# Patient Record
Sex: Female | Born: 1980 | Hispanic: No | Marital: Single | Smoking: Never smoker
Health system: Southern US, Community
[De-identification: ages and names within clinical notes are randomized; demographics above are authoritative.]

## PROBLEM LIST (undated history)

## (undated) DIAGNOSIS — R519 Headache, unspecified: Secondary | ICD-10-CM

## (undated) DIAGNOSIS — K219 Gastro-esophageal reflux disease without esophagitis: Secondary | ICD-10-CM

## (undated) DIAGNOSIS — I10 Essential (primary) hypertension: Secondary | ICD-10-CM

## (undated) DIAGNOSIS — R87629 Unspecified abnormal cytological findings in specimens from vagina: Secondary | ICD-10-CM

## (undated) HISTORY — PX: APPENDECTOMY (OPEN): SHX54

## (undated) HISTORY — DX: Essential (primary) hypertension: I10

## (undated) HISTORY — PX: APPENDECTOMY: SHX54

## (undated) HISTORY — DX: Unspecified abnormal cytological findings in specimens from vagina: R87.629

## (undated) HISTORY — PX: KNEE SURGERY: SHX244

## (undated) HISTORY — DX: Headache, unspecified: R51.9

## (undated) HISTORY — DX: Gastro-esophageal reflux disease without esophagitis: K21.9

---

## 2014-01-06 ENCOUNTER — Emergency Department: Payer: Commercial Managed Care - POS

## 2014-01-06 ENCOUNTER — Emergency Department
Admission: EM | Admit: 2014-01-06 | Discharge: 2014-01-06 | Disposition: A | Discharge status: Home / Self Care | Payer: Commercial Managed Care - POS | Attending: Emergency Medicine | Admitting: Emergency Medicine

## 2014-01-06 DIAGNOSIS — R519 Headache, unspecified: Secondary | ICD-10-CM

## 2014-01-06 DIAGNOSIS — D72829 Elevated white blood cell count, unspecified: Secondary | ICD-10-CM | POA: Insufficient documentation

## 2014-01-06 DIAGNOSIS — R51 Headache: Secondary | ICD-10-CM | POA: Insufficient documentation

## 2014-01-06 DIAGNOSIS — I159 Secondary hypertension, unspecified: Secondary | ICD-10-CM

## 2014-01-06 DIAGNOSIS — G8929 Other chronic pain: Secondary | ICD-10-CM | POA: Insufficient documentation

## 2014-01-06 DIAGNOSIS — I10 Essential (primary) hypertension: Secondary | ICD-10-CM | POA: Insufficient documentation

## 2014-01-06 LAB — CBC AND DIFFERENTIAL
Basophils Absolute Automated: 0.02 10*3/uL (ref 0.00–0.20)
Basophils Automated: 0 %
Eosinophils Absolute Automated: 0.06 10*3/uL (ref 0.00–0.70)
Eosinophils Automated: 0 %
Hematocrit: 41.8 % (ref 37.0–47.0)
Hgb: 14.8 g/dL (ref 12.0–16.0)
Immature Granulocytes Absolute: 0.04 10*3/uL
Immature Granulocytes: 0 %
Lymphocytes Absolute Automated: 2.8 10*3/uL (ref 0.50–4.40)
Lymphocytes Automated: 21 %
MCH: 31.2 pg (ref 28.0–32.0)
MCHC: 35.4 g/dL (ref 32.0–36.0)
MCV: 88 fL (ref 80.0–100.0)
MPV: 10.9 fL (ref 9.4–12.3)
Monocytes Absolute Automated: 0.82 10*3/uL (ref 0.00–1.20)
Monocytes: 6 %
Neutrophils Absolute: 9.75 10*3/uL — ABNORMAL HIGH (ref 1.80–8.10)
Neutrophils: 73 %
Nucleated RBC: 0 /100 WBC (ref 0–1)
Platelets: 297 10*3/uL (ref 140–400)
RBC: 4.75 10*6/uL (ref 4.20–5.40)
RDW: 13 % (ref 12–15)
WBC: 13.45 10*3/uL — ABNORMAL HIGH (ref 3.50–10.80)

## 2014-01-06 LAB — HEMOLYSIS INDEX
Hemolysis Index: 242 — ABNORMAL HIGH (ref 0–18)
Hemolysis Index: 7 (ref 0–18)

## 2014-01-06 LAB — POCT PREGNANCY TEST, URINE HCG: POCT Pregnancy HCG Test, UR: NEGATIVE

## 2014-01-06 LAB — POTASSIUM: Potassium: 3.8 mEq/L (ref 3.5–5.1)

## 2014-01-06 LAB — BASIC METABOLIC PANEL
Anion Gap: 9 (ref 5.0–15.0)
BUN: 15 mg/dL (ref 7.0–19.0)
CO2: 23 mEq/L (ref 22–29)
Calcium: 9.9 mg/dL (ref 8.5–10.5)
Chloride: 103 mEq/L (ref 100–111)
Creatinine: 1.1 mg/dL — ABNORMAL HIGH (ref 0.6–1.0)
Glucose: 100 mg/dL (ref 70–100)
Potassium: 6.2 mEq/L (ref 3.5–5.1)
Sodium: 135 mEq/L — ABNORMAL LOW (ref 136–145)

## 2014-01-06 LAB — SEDIMENTATION RATE: Sed Rate: 6 mm/Hr (ref 0–20)

## 2014-01-06 LAB — GFR: EGFR: 56.9

## 2014-01-06 MED ORDER — PROMETHAZINE HCL 25 MG PO TABS
25.0000 mg | ORAL_TABLET | Freq: Four times a day (QID) | ORAL | Status: AC | PRN
Start: 2014-01-06 — End: ?

## 2014-01-06 MED ORDER — IBUPROFEN 600 MG PO TABS
600.0000 mg | ORAL_TABLET | Freq: Once | ORAL | Status: DC
Start: 2014-01-06 — End: 2014-01-06
  Filled 2014-01-06: qty 1

## 2014-01-06 MED ORDER — HYDROCODONE-ACETAMINOPHEN 5-325 MG PO TABS
1.0000 | ORAL_TABLET | ORAL | Status: AC | PRN
Start: 2014-01-06 — End: 2014-01-16

## 2014-01-06 NOTE — ED Provider Notes (Signed)
EMERGENCY DEPARTMENT HISTORY AND PHYSICAL        Physician/Midlevel provider first contact with patient: 01/06/14 1538       Date: 01/06/2014  Patient Name: Natalie Wolf  Chief Complaint:   Chief Complaint   Patient presents with   . Headache       History of Presenting Illness           History Provided By: the patient.  Translation services  were not used.  Sign language services were not used.    HPI    Chief Complaint: headache  Onset: x6 days  Timing: constant  Location: occipital region and back of neck  Quality: aching  Severity: moderate  Modifying Factors: no relief with headache powder  Associated Symptoms:  Tingling in left arm and hand      Chief Complaint   Headache      Patient presents for evaluation of headache.  The pain is located in the occipital region and the back of her neck.  Onset of symptoms was gradual starting 6 days ago in frontal region and has moved to occipital region since.  The pain occurs regularly constantly.  Pain is described as aching. This is different from prior HA's.  This is not the worst headache of the patient's life.     It is brought on by no particular thing.  It is relieved by nothing.  The patient rates the pain as moderate.  The patient also complains of tingling in her left arm and hand.  The patient denies muscle weakness and speech difficulties. She additionally denies nausea, vomiting, fever, and chills. The patient has a history of frequent frontal headaches for the past 8 months and a history of hypertension.    The patient has had no prior headache workup. Care prior to arrival consisted of headache powder, with no relief.      PCP: Pcp, Noneorunknown, MD (General)    LMP: Patient's last menstrual period was 12/30/2013 (exact date).    GYN HX:   Obstetric History     No data available       Blood Type:         Past History     Past Medical History:  History reviewed. No pertinent past medical history.        Past Surgical History:  Past Surgical History    Procedure Laterality Date   . Appendectomy           Family History:  History reviewed. No pertinent family history.        Social History:  History   Substance Use Topics   . Smoking status: Never Smoker    . Smokeless tobacco: Not on file   . Alcohol Use: Yes      Comment: social         Allergies:  No Known Allergies    No current facility-administered medications for this encounter.     Current Outpatient Prescriptions   Medication Sig Dispense Refill   . CRYSELLE-28 0.3-30 MG-MCG per tablet      . lisinopril-hydrochlorothiazide (PRINZIDE,ZESTORETIC) 10-12.5 MG per tablet      . HYDROcodone-acetaminophen (NORCO) 5-325 MG per tablet Take 1-2 tablets by mouth every 4 (four) hours as needed for Pain. 20 tablet 0   . promethazine (PHENERGAN) 25 MG tablet Take 1 tablet (25 mg total) by mouth every 6 (six) hours as needed for Nausea. 20 tablet 0       Review of Systems  Review of Systems   Constitutional: Negative for fever, diaphoresis, activity change, appetite change and fatigue.   HENT: Negative for ear pain, rhinorrhea and sore throat.    Eyes: Negative for pain, discharge, redness and visual disturbance.   Respiratory: Negative for apnea, cough, chest tightness, shortness of breath, wheezing and stridor.    Cardiovascular: Negative for chest pain, palpitations and leg swelling.   Gastrointestinal: Negative for nausea, vomiting, abdominal pain, diarrhea, blood in stool and abdominal distention.   Musculoskeletal: Negative for myalgias, arthralgias, neck pain and neck stiffness.   Skin: Negative for color change, pallor and rash.   Neurological: Positive for headaches. Negative for dizziness, seizures, syncope, speech difficulty, weakness and numbness.        Positive for tingling in left arm and hand   Hematological: Does not bruise/bleed easily.          Physical Exam     Medication List Reviewed: Yes      BP 149/97 mmHg  Pulse 86  Temp(Src) 98.8 F (37.1 C)  Resp 18  Ht 1.651 m  Wt 107.049 kg  BMI  39.27 kg/m2  SpO2 98%  LMP 12/30/2013 (Exact Date)    Pulse Oximetry: 98%  (RA unless specified)    pupils equal  nose-to-finger normal  lungs clear  RSR w/o murmurs.  normal neuro .neck neg romberg  Physical Exam   Constitutional: She is oriented to person, place, and time. She appears well-developed and well-nourished. No distress.   HENT:   Head: Normocephalic and atraumatic.   Right Ear: External ear normal.   Left Ear: External ear normal.   Mouth/Throat: Oropharynx is clear and moist. No oropharyngeal exudate.   Eyes: Conjunctivae and EOM are normal. Pupils are equal, round, and reactive to light.   Neck: Normal range of motion. Neck supple. No JVD present.   Neck is ABSOLUTELY supple with full range of active motion and no meningismus.     Cardiovascular: Normal rate, regular rhythm and normal heart sounds.    No murmur heard.  Pulmonary/Chest: Effort normal and breath sounds normal. No stridor. No respiratory distress. She has no wheezes. She has no rales. She exhibits no tenderness.   Abdominal: Soft. She exhibits no distension. There is no tenderness. There is no rebound and no guarding.   Musculoskeletal: Normal range of motion.   Neurological: She is alert and oriented to person, place, and time. No cranial nerve deficit.   Normal nose to finger. Negative Romberg.   Skin: Skin is warm and dry. No rash noted. She is not diaphoretic. No erythema. No pallor.   Psychiatric: She has a normal mood and affect. Her behavior is normal. Judgment and thought content normal.   Nursing note and vitals reviewed.        Diagnostic Study Results     Labs -     Results    Procedure Component Value Units Date/Time    Potassium [[id number]] Collected:  01/06/14 1641    Specimen Information:  Blood Updated:  01/06/14 1703     Potassium 3.8 mEq/L     Narrative:      Repeat K+ ON NEW BLOOD DRAW.    Hemolysis index [[id number]] Collected:  01/06/14 1641     Hemolysis Index 7 Updated:  01/06/14 1703    Narrative:      Repeat K+  ON NEW BLOOD DRAW.    Sedimentation rate (ESR) [[id number]] Collected:  01/06/14 1608    Specimen Information:  Blood Updated:  01/06/14 1635     Sed Rate 6 mm/Hr     Basic Metabolic Panel [[id number]]  (Abnormal) Collected:  01/06/14 1608    Specimen Information:  Blood Updated:  01/06/14 1633     Glucose 100 mg/dL      BUN 01.0 mg/dL      Creatinine 1.1 (H) mg/dL      CALCIUM 9.9 mg/dL      Sodium 272 (L) mEq/L      Potassium 6.2 (HH) mEq/L      Chloride 103 mEq/L      CO2 23 mEq/L      Anion Gap 9.0     Hemolysis index [[id number]]  (Abnormal) Collected:  01/06/14 1608     Hemolysis Index 242 (H) Updated:  01/06/14 1633    GFR [[id number]] Collected:  01/06/14 1608     EGFR 56.9 Updated:  01/06/14 1633    CBC and differential [[id number]]  (Abnormal) Collected:  01/06/14 1608    Specimen Information:  Blood / Blood Updated:  01/06/14 1616     WBC 13.45 (H) x10 3/uL      RBC 4.75 x10 6/uL      Hgb 14.8 g/dL      Hematocrit 18.8 %      MCV 88.0 fL      MCH 31.2 pg      MCHC 35.4 g/dL      RDW 13 %      Platelets 297 x10 3/uL      MPV 10.9 fL      Neutrophils 73 %      Lymphocytes Automated 21 %      Monocytes 6 %      Eosinophils Automated 0 %      Basophils Automated 0 %      Immature Granulocyte 0 %      Nucleated RBC 0 /100 WBC      Neutrophils Absolute 9.75 (H) x10 3/uL      Abs Lymph Automated 2.80 x10 3/uL      Abs Mono Automated 0.82 x10 3/uL      Abs Eos Automated 0.06 x10 3/uL      Absolute Baso Automated 0.02 x10 3/uL      Absolute Immature Granulocyte 0.04 x10 3/uL     Urine HCG - POCT [[id number]] Collected:  01/06/14 1610    Specimen Information:  Urine Updated:  01/06/14 1610     POCT QC Pass      POCT Pregnancy HCG Test, UR Negative      Comment:        Result:        Negative Value is Normal in Healthy Males or Healthy non-pregnant Females              Radiologic Studies -   Radiology Results (24 Hour)    Procedure Component Value Units Date/Time    CT Head WO Contrast [[id number]] Collected:  01/06/14  1650    Order Status:  Completed Updated:  01/06/14 1655    Narrative:      HISTORY: Headaches    TECHNIQUE:  Non enhanced Computed tomography the head was performed at 5  mm slice thickness.     PRIORS: None.    FINDINGS:       The ventricular system is normal in size, shape and contour. There  is no midline shift or herniation. No acute intracranial hemorrhage is  seen. There are no extra-axial fluid collections.   There is minimal  mucosal thickening involving the left sphenoid sinus.  The rest of the paranasal sinuses and mastoid air cells are clear. The  bony structures unremarkable..      Impression:       Minimal sphenoid sinus mucosal disease. No acute  intracranial abnormalities.      Georgana Curio, MD   01/06/2014 4:51 PM            EKG/MONITOR    Current EKG: not performed    Old EKG:   not reviewed or none available    Cardiac Monitor: not monitored      .      Medical Decision Making   I am the first provider for this patient.    I reviewed the vital signs, available nursing notes, past medical history, past surgical history, family history and social history.      Records Review: Visit history       ED Course:   4:47 PM - Informed patient of need to redraw blood for potassium testing due to hemolysis of initial sample.    4:51 PM -  The reason(s) for the excessive length of stay for this patient include the following: awaiting blood redraw for potassium level    6:01 PM  Pt is feeling better and would like to go home.  Discussed test results with pt and counseled on diagnosis, f/u plans, and signs and symptoms when to return to ED.  Pt is stable and ready for discharge.        Provider Notes:       Procedures: none      Core Measures:  none      Critical Care Time: none          Diagnosis     Clinical Impression:  Primary diagnosis is in boldface  1. Chronic headache, probable migraine    2. Mild leukocytosis    3. Hypertension under treatment        DISPOSITION    discharged home     DISPOSITION  CONDITION    stable    Vital Signs-.     Patient Vitals for the past 12 hrs:   BP Temp Pulse Resp   01/06/14 1812 149/97 mmHg - 86 18   01/06/14 1551 165/99 mmHg 98.8 F (37.1 C) 97 16             _______________________________    Attestations:    This note is prepared by Gerline Legacy, acting as Scribe for Harless Litten, MD.    Harless Litten, MD.  The scribe's documentation has been prepared under my direction and personally reviewed by me in its entirety.  I confirm that the note above accurately reflects all work, treatment, procedures, and medical decision making performed by me.        _______________________________      CHART RECONCILIATION: CHART OWNERSHIP: Dr. Gaylord Shih is the primary emergency physician of record.                                            Harless Litten, MD  01/06/14 2255

## 2014-01-06 NOTE — ED Notes (Signed)
Headache x7 days, unrelived by Goodies powder.  Denies nausea, photophobia, numbness or tingling.

## 2014-01-06 NOTE — Discharge Instructions (Signed)
There are many causes of headache, and it can be difficult to exclude dangerous causes without a full battery of diagnostic tests, including CT, lumbar puncture, MRI and others.  In most cases, dangerous etiologies can be excluded on the basis of history and physical examination, and we did not feel today that your headache was dangerous or life threatening. However, you should return to the ER immediately for increased headache, fever, vomiting, trouble speaking or any focal weakness, stiff neck, irritability or lethargy or any other concern.  Otherwise, you should follow up with your primary care doctor or referral doctor as directed.    Headache    You have been treated for a headache.    Headaches are very common. Most of the time they are benign (not harmful). Some headaches can be very serious. Your headache appears to be benign. The doctor feels it is safe for you to go home.    If you continue to have headaches, or if this headache does not resolve over the next few days, you should be evaluated by your regular doctor or a neurologist. Keep a "headache diary." This may help your doctor learn the cause of your headaches.    Take your headache medication as directed. This is especially important if your doctor has placed you on a daily medication to prevent headaches.    YOU SHOULD SEEK MEDICAL ATTENTION IMMEDIATELY, EITHER HERE OR AT THE NEAREST EMERGENCY DEPARTMENT, IF ANY OF THE FOLLOWING OCCURS:   Your headache gets worse.   You have a severe headache that occurs suddenly.   Your head pain is different from your normal headache.   You have a fever, especially with a stiff neck.   You feel numbness, tingling, or weakness in your arms or legs.   You pass out.   You have problems with your vision.   You vomit and have trouble taking medication or keeping it down.        You may have been given a prescription for a new blood pressure medicine.  Many times, the doses need to be adjusted so it is  important to follow up with your doctor.  You should check your blood pressure daily and call your doctor (or return to the ER) for any symptoms such as headache, chest pain or shortness of breath or if your systolic (top number) is > 180 or your diastolic (bottom number) is > 90.      Hypertension    You have been diagnosed with elevated blood pressure.    The medical term for high blood pressure is hypertension. Many people feel anxious or uncomfortable about being at the hospital. If you feel anxious today, this could make your blood pressure appear high, even if your blood pressure is usually normal. Check your blood pressure several more times when you are not feeling stress. Keep a record of these readings and give this information to your regular doctor. He or she will decide whether you have hypertension that requires medical treatment.    If your blood pressure becomes extremely high all of a sudden, you will probably notice symptoms. In fact, very high blood pressure is a medical emergency. Most people with hypertension have blood pressure that is only a little too high. Mild high blood pressure does not cause specific symptoms. Instead, the effects of hypertension develop slowly over time. Untreated hypertension can affect the heart, brain, kidneys, eyes, and blood vessels. Unfortunately, by the time side-effects become noticeable, the body has  already been damaged. This is why hypertension is called "the silent killer!"    It is important to follow up with your regular doctor. Check your blood pressure several times in the next 1 to 2 weeks and tell your doctor about the results. It may be helpful to keep a log or a journal where you can write down your blood pressures. Note the time of day and the activity you were doing when the reading was taken.    YOU SHOULD SEEK MEDICAL ATTENTION IMMEDIATELY, EITHER HERE OR AT THE NEAREST EMERGENCY DEPARTMENT, IF ANY OF THE FOLLOWING OCCURS:   You have a  headache.   You have chest pain.   You are short of breath or have trouble breathing.    You feel weak, especially on only one side of the body.   Your symptoms get worse or you have other concerns.

## 2014-03-07 ENCOUNTER — Emergency Department: Payer: Commercial Managed Care - POS

## 2014-03-07 ENCOUNTER — Emergency Department
Admission: EM | Admit: 2014-03-07 | Discharge: 2014-03-07 | Disposition: A | Discharge status: Home / Self Care | Payer: Commercial Managed Care - HMO | Attending: Emergency Medicine | Admitting: Emergency Medicine

## 2014-03-07 DIAGNOSIS — Z91148 Patient's other noncompliance with medication regimen for other reason: Secondary | ICD-10-CM

## 2014-03-07 DIAGNOSIS — R209 Unspecified disturbances of skin sensation: Secondary | ICD-10-CM | POA: Insufficient documentation

## 2014-03-07 DIAGNOSIS — Z91199 Patient's noncompliance with other medical treatment and regimen due to unspecified reason: Secondary | ICD-10-CM | POA: Insufficient documentation

## 2014-03-07 DIAGNOSIS — F419 Anxiety disorder, unspecified: Secondary | ICD-10-CM

## 2014-03-07 DIAGNOSIS — G44219 Episodic tension-type headache, not intractable: Secondary | ICD-10-CM | POA: Insufficient documentation

## 2014-03-07 DIAGNOSIS — I10 Essential (primary) hypertension: Secondary | ICD-10-CM

## 2014-03-07 DIAGNOSIS — R202 Paresthesia of skin: Secondary | ICD-10-CM

## 2014-03-07 DIAGNOSIS — F411 Generalized anxiety disorder: Secondary | ICD-10-CM | POA: Insufficient documentation

## 2014-03-07 LAB — COMPREHENSIVE METABOLIC PANEL
ALT: 25 U/L (ref 0–55)
AST (SGOT): 22 U/L (ref 5–34)
Albumin/Globulin Ratio: 1.4 (ref 0.9–2.2)
Albumin: 4 g/dL (ref 3.5–5.0)
Alkaline Phosphatase: 65 U/L (ref 37–106)
Anion Gap: 10 (ref 5.0–15.0)
BUN: 15 mg/dL (ref 7.0–19.0)
Bilirubin, Total: 0.4 mg/dL (ref 0.2–1.2)
CO2: 25 mEq/L (ref 22–29)
Calcium: 9.6 mg/dL (ref 8.5–10.5)
Chloride: 105 mEq/L (ref 100–111)
Creatinine: 1 mg/dL (ref 0.6–1.0)
Globulin: 2.9 g/dL (ref 2.0–3.6)
Glucose: 98 mg/dL (ref 70–100)
Potassium: 3.5 mEq/L (ref 3.5–5.1)
Protein, Total: 6.9 g/dL (ref 6.0–8.3)
Sodium: 140 mEq/L (ref 136–145)

## 2014-03-07 LAB — URINALYSIS, REFLEX TO MICROSCOPIC EXAM IF INDICATED
Bilirubin, UA: NEGATIVE
Blood, UA: NEGATIVE
Glucose, UA: NEGATIVE
Ketones UA: NEGATIVE
Leukocyte Esterase, UA: NEGATIVE
Nitrite, UA: NEGATIVE
Protein, UR: NEGATIVE
Specific Gravity UA: 1.015 (ref 1.001–1.035)
Urine pH: 7 (ref 5.0–8.0)
Urobilinogen, UA: NORMAL mg/dL

## 2014-03-07 LAB — CBC AND DIFFERENTIAL
Basophils Absolute Automated: 0.02 10*3/uL (ref 0.00–0.20)
Basophils Automated: 0 %
Eosinophils Absolute Automated: 0.12 10*3/uL (ref 0.00–0.70)
Eosinophils Automated: 1 %
Hematocrit: 36.5 % — ABNORMAL LOW (ref 37.0–47.0)
Hgb: 12.9 g/dL (ref 12.0–16.0)
Lymphocytes Absolute Automated: 2.72 10*3/uL (ref 0.50–4.40)
Lymphocytes Automated: 33 %
MCH: 30.7 pg (ref 28.0–32.0)
MCHC: 35.3 g/dL (ref 32.0–36.0)
MCV: 86.9 fL (ref 80.0–100.0)
MPV: 9.8 fL (ref 9.4–12.3)
Monocytes Absolute Automated: 0.57 10*3/uL (ref 0.00–1.20)
Monocytes: 7 %
Neutrophils Absolute: 4.91 10*3/uL (ref 1.80–8.10)
Neutrophils: 59 %
Platelets: 250 10*3/uL (ref 140–400)
RBC: 4.2 10*6/uL (ref 4.20–5.40)
RDW: 13 % (ref 12–15)
WBC: 8.34 10*3/uL (ref 3.50–10.80)

## 2014-03-07 LAB — TROPONIN I: Troponin I: <0.01 ng/mL (ref 0.00–0.09)

## 2014-03-07 LAB — URINE HCG QUALITATIVE: Urine HCG Qualitative: NEGATIVE

## 2014-03-07 LAB — GFR: EGFR: >60

## 2014-03-07 MED ORDER — SODIUM CHLORIDE 0.9 % IV BOLUS
1000.0000 mL | Freq: Once | INTRAVENOUS | Status: AC
Start: 2014-03-07 — End: 2014-03-07
  Administered 2014-03-07: 1000 mL via INTRAVENOUS

## 2014-03-07 MED ORDER — LISINOPRIL-HYDROCHLOROTHIAZIDE 10-12.5 MG PO TABS
1.0000 | ORAL_TABLET | Freq: Every day | ORAL | Status: AC
Start: 2014-03-07 — End: ?

## 2014-03-07 MED ORDER — LORAZEPAM 1 MG PO TABS
1.0000 mg | ORAL_TABLET | Freq: Three times a day (TID) | ORAL | Status: AC | PRN
Start: 2014-03-07 — End: ?

## 2014-03-07 MED ORDER — KETOROLAC TROMETHAMINE 30 MG/ML IJ SOLN
30.0000 mg | Freq: Once | INTRAMUSCULAR | Status: AC
Start: 2014-03-07 — End: 2014-03-07
  Administered 2014-03-07: 30 mg via INTRAVENOUS
  Filled 2014-03-07: qty 1

## 2014-03-07 NOTE — ED Provider Notes (Signed)
EMERGENCY DEPARTMENT HISTORY AND PHYSICAL EXAM     Physician/Midlevel provider first contact with patient: 03/07/14 1854         Date: 03/07/2014  Patient Name: Natalie Wolf  Attending Physician: Gweneth Dimitri DO  Diagnosis and Treatment Plan       Clinical Impression:   1. Essential hypertension    2. Non compliance w medication regimen    3. Paresthesia of both hands    4. Episodic tension-type headache, not intractable    5. Anxiety        Treatment Plan:   ED Disposition     Discharge Natalie Wolf discharge to home/self care.    Condition at disposition: Stable            History of Presenting Illness     Chief Complaint   Patient presents with   . Hypertension   . Dizziness       History Provided By: Patient    Chief Complaint: Dizziness  Onset: 1:00 PM  Timing: Intermittent  Quality: Lightheadedness  Severity: Moderate  Exacerbating factors: worse secondary to stress  Alleviating factors: relieved with relaxation.   Associated symptoms: Headache, chest discomfort, tingling  Pertinent negatives: No blurred vision, double vision, rhinorrhea, congestion, SOB, cough, hemoptysis, dysuria, hematuria, rash, numbness, weakness    Additional History: Natalie Wolf is a 33 y.o. female c/o dizziness x 1:00 PM. Pt describes dizziness as lightheadedness. Pt also c/o headache that she describes as diffuse "pressure" x 2 days, chest discomfort, and intermittent tingling to feet x 1 week. Pt states she thinks sxs are related to anxiety although she has not previously had that diagnosis because she has been under increased stress and noticed correlation between stress and onset of sxs. Denies blurred vision, double vision, rhinorrhea, congestion, SOB, cough, hemoptysis, dysuria, hematuria, rash, numbness, weakness. Hx HTN that she states she is inconsistent with managing.     Cardiac risk factors: The patient has history of: HTN. The patient denies any history of CAD, MI, hyperlipidemia, diabetes, cocaine  use, smoking, or family history of CAD at a young age.    PE risk factors:The patient denies any recent surgery, major trauma, cancer, immobility, prolonged travel, or hormone therapy, as well as denying any history of clotting disorders or personal history of venous thromboembolisms.    PCP: Pcp, Noneorunknown, MD    No current facility-administered medications for this encounter.  Current outpatient prescriptions: lisinopril-hydrochlorothiazide (PRINZIDE,ZESTORETIC) 10-12.5 MG per tablet, Take 1 tablet by mouth daily., Disp: 30 tablet, Rfl: 0;  LORazepam (ATIVAN) 1 MG tablet, Take 1 tablet (1 mg total) by mouth every 8 (eight) hours as needed for Anxiety., Disp: 20 tablet, Rfl: 0;  promethazine (PHENERGAN) 25 MG tablet, Take 1 tablet (25 mg total) by mouth every 6 (six) hours as needed for Nausea., Disp: 20 tablet, Rfl: 0    Past Medical History     Past Medical History   Diagnosis Date   . Hypertension      Past Surgical History   Procedure Laterality Date   . Appendectomy         Family History     History reviewed. No pertinent family history.    Social History     History     Social History   . Marital Status: Single     Spouse Name: N/A     Number of Children: N/A   . Years of Education: N/A     Social History Main Topics   .  Smoking status: Never Smoker    . Smokeless tobacco: Not on file   . Alcohol Use: Yes      Comment: social   . Drug Use: No   . Sexual Activity: Not on file     Other Topics Concern   . Not on file     Social History Narrative        Allergies     No Known Allergies    Review of Systems     General: No fever, no sweats, no chills.   Eyes: No blurred or double vision.   HENT: (+) Headache. No neck pain, no cold symptoms.  Respiratory: No cough, no shortness of breath, no hemoptysis, no wheezing.  Cardiovascular: (+) Chest discomfort. No calf pain, no leg swelling.   Gastrointestinal: No abdominal pain, no nausea, no vomiting, no diarrhea.   Genito-Urinary: No dysuria, no  hematuria  Musculoskeletal: No back pain, no no sensory changes. No numbness or tingling.   Dermatological: No new rashes, no color chaneck pain.    Neurological: (+) Dizziness and tingling. No new focal weakness, no sensory changes. No numbness or tingling.   Dermatological: No new rashes, no color change  Psychological: No acute mood changes, no confusion.ges.       Physical Exam     BP 135/85 mmHg  Pulse 74  Temp(Src) 98.4 F (36.9 C)  Resp 16  Ht 1.676 m  Wt 106.142 kg  BMI 37.79 kg/m2  SpO2 98%  LMP 03/03/2014    Constitutional: Vital signs reviewed. Well appearing, no apparent distress.  Head: Normocephalic, atraumatic. No external trauma noted.  Eyes: Conjunctiva and sclera are normal. Pupils equal, round, reactive.  Ear, Nose, Throat:  Normal external examination of the nose and ears. Oropharynx clear, moist mucous membranes. No tonsillar swelling or exudates.   Neck: Supple. Trachea midline.  No cervical lymphadenopathy. No midline cervical spine tenderness.  Respiratory: Clear to auscultation. No respiratory distress. No wheezing, rhonchi or rales.  Cardiovascular: Regular rate. Regular rhythm. S1, S2. No murmur.  Chest: No chest wall tenderness or crepitus.   Abdomen: Normoactive Bowel sounds. Soft. Non-tender to palpation. No guarding or rebound.  Back: No midline tenderness, no CVA tenderness.   Extremities: Upper and lower extremities with no cyanosis or edema. No calf tenderness. Normal +2 pulses in all extremities.  Skin: Warm and dry. No rash.  Neuro: alert and appropriate, normal speech, no facial droop, moving all extremities.     Diagnostic Study Results     Labs -     Results     Procedure Component Value Units Date/Time    Troponin I [[id number]] Collected:  03/07/14 1911    Specimen Information:  Blood Updated:  03/07/14 1950     Troponin I <0.01 ng/mL     CBC and differential [[id number]]  (Abnormal) Collected:  03/07/14 1911    Specimen Information:  Blood / Blood Updated:  03/07/14  1948     WBC 8.34 x10 3/uL      RBC 4.20 x10 6/uL      Hgb 12.9 g/dL      Hematocrit 62.1 (L) %      MCV 86.9 fL      MCH 30.7 pg      MCHC 35.3 g/dL      RDW 13 %      Platelets 250 x10 3/uL      MPV 9.8 fL      Neutrophils 59 %  Lymphocytes Automated 33 %      Monocytes 7 %      Eosinophils Automated 1 %      Basophils Automated 0 %      Immature Granulocyte Unmeasured %      Neutrophils Absolute 4.91 x10 3/uL      Abs Lymph Automated 2.72 x10 3/uL      Abs Mono Automated 0.57 x10 3/uL      Abs Eos Automated 0.12 x10 3/uL      Absolute Baso Automated 0.02 x10 3/uL      Absolute Immature Granulocyte Unmeasured x10 3/uL     Comprehensive metabolic panel [[id number]] Collected:  03/07/14 1911    Specimen Information:  Blood Updated:  03/07/14 1948     Glucose 98 mg/dL      BUN 40.9 mg/dL      Creatinine 1.0 mg/dL      Sodium 811 mEq/L      Potassium 3.5 mEq/L      Chloride 105 mEq/L      CO2 25 mEq/L      CALCIUM 9.6 mg/dL      Protein, Total 6.9 g/dL      Albumin 4.0 g/dL      AST (SGOT) 22 U/L      ALT 25 U/L      Alkaline Phosphatase 65 U/L      Bilirubin, Total 0.4 mg/dL      Globulin 2.9 g/dL      Albumin/Globulin Ratio 1.4      Anion Gap 10.0     GFR [[id number]] Collected:  03/07/14 1911     EGFR >60.0 Updated:  03/07/14 1948    UA, Reflex to Microscopic [[id number]] Collected:  03/07/14 1911    Specimen Information:  Urine Updated:  03/07/14 1926     Urine Type Clean Catch      Color, UA Straw      Clarity, UA Clear      Specific Gravity UA 1.015      Urine pH 7.0      Leukocyte Esterase, UA Negative      Nitrite, UA Negative      Protein, UR Negative      Glucose, UA Negative      Ketones UA Negative      Urobilinogen, UA Normal mg/dL      Bilirubin, UA Negative      Blood, UA Negative     Urine HCG, Qualitative [[id number]] Collected:  03/07/14 1911    Specimen Information:  Urine Updated:  03/07/14 1926     Urine HCG Qualitative Negative           Radiologic Studies -   Radiology Results (24 Hour)     **  No results found for the last 24 hours. **      .    Medical Decision Making   I am the first provider for this patient.    I reviewed the vital signs, available nursing notes, past medical history, past surgical history, family history and social history.    Vital Signs-Reviewed the patient's vital signs.     No data found.      Old records: No prior cardiac work up.     Adult Chest Pain:  12-lead EKG was preformed in the ED. Aspirin was not given as non-cardiac.    EKG:  Interpreted by the Emergency Physician.   Time Interpreted: 6:51 PM     Rate: 71 bpm   Rhythm:  Normal Sinus Rhythm   Interpretation: Flipped T wave in V1. Normal axis, normal intervals, no ST elevation   Comparison: No prior study is available for comparison.    Pulse Oximetry Interpretation: Normal 98% on RA    ED Course:   8:19 PM -  Asked pt if she would like Rx for anxiety to take PRN. Pt states she would like to have it just in case. Discussed importance of compliance with HTN meds. Pt states she understands and feels better as her headache has now completely resolved. Pt is amenable to discharge. Pt has been informed of results and understands. Discharge instructions, including f/u and return precautions, have been discussed. Pt understands and agrees with plan. All questions and concerns were addressed and resolved.       Provider Notes/Medical Decision Making:   I do not feel this patient's chest pain is cardiac in nature due to the characteristics of the history and patient's risk factors. I do not feel this patient's chest pain is related to a PE as the patient's oxygen saturations are wnl, and the patient's PERC score is zero, therefore I do not feel further lab work or imaging is required for a blood clot. I do not feel this patient has an aortic dissection as no tearing pain radiating to the back, normal mediastinum on xray, normal pulses in all extremities and no neurological complaints. I do not think this patient's chest pain is  secondary to lung pathology such as pneumothorax or pneumonia as the chest x-ray is clear of these causes and the patient's lungs were clear to auscultation bilaterally.   Patient was given clear instructions on when to return to the emergency department and follow up instructions.        Doctor's Notes     Throughout the stay in the Emergency Department, questions and concerns surrounding pain control, care plans, diagnostic studies, effects of medications administered or prescribed, and future prognostic dilemmas were assessed and addressed.    ROS addendum: The patient and/or family was asked if they had any other complaints or concerns that we could address today and nothing of significance was noted.     _______________________________  Medical DeMedical Decision Makingcision Stacy Gardner  Attestations:    This note is prepared by Silva Bandy, acting as Scribe for Gweneth Dimitri, DO.     Gweneth Dimitri, DO: The scribe's documentation has been prepared under my direction and personally reviewed by me in its entirety.  I confirm that the note above accurately reflects all work, treatment, procedures, and medical decision making performed by me.        Madaline Brilliant, DO  03/10/14 0207

## 2014-03-07 NOTE — Discharge Instructions (Signed)
Hypertension, Exacerbation    You have been previously diagnosed with hypertension. Hypertension means high blood pressure that happens every day. To be diagnosed with hypertension, the blood pressure readings must be abnormally high at least 3 different times. There are causes for high blood pressure that doctors can find. These include being overweight or having a kidney or hormone problem. This is called secondary hypertension. When a doctor does not know the cause, it is called "essential hypertension." Both types of hypertension may require medicine to lower the blood pressure. Some people with high blood pressure will improve if they limit the sodium (salt) in their diets.    High blood pressure often has no symptoms. However, it can cause headaches or vision problems. In rare cases, very high blood pressure can cause seizures (fits). It is important to diagnose and treat hypertension even if there are no symptoms. This is because high blood pressure can cause damage to organs like the heart and kidneys. This damage can be permanent (not go away). That is why it is very important to take all medicines prescribed for your condition.    You should have another blood pressure reading in 1 to 2 days.    See your doctor in the next 24 hours.     YOU SHOULD SEEK MEDICAL ATTENTION IMMEDIATELY, EITHER HERE OR AT THE NEAREST EMERGENCY DEPARTMENT IF ANY OF THE FOLLOWING OCCUR:   Your chest hurts or you have trouble breathing.   You have a severe headache or have trouble seeing.   You have seizures.   You vomit (throw up) repeatedly or get more ill.           Paresthesias    You have been seen for paresthesias.    Paresthesia is an abnormal sensation (feeling) in any part of the body. The paresthesia itself has no long-term bad effects. People often describe it as tingling, numbness, burning, or pricking of the skin. Many say it feels like "pins and needles," or like the body part is asleep.    Paresthesias  can be a symptom of some illnesses. This means there are many things that can cause paresthesias. The paresthesias can be a sign of an underlying medical condition.     Some causes of paresthesias are:   Skin Problems: Irritation of skin by certain chemicals. Swelling of the skin from an injury. A burn or frostbite can feel like numbness.   Pressure on a nerve. This can happen when your arm "falls asleep" from lying on it too long. Carpal tunnel syndrome can do the same thing.   Hyperventilation (rapid or deep breathing).   Deficiency in some vitamins and minerals. This includes vitamins B1, B5, and B12.   Electrolyte problems.   Diabetic neuropathy (nerve disorders) from long-standing diabetes.   Problems with circulation.   Strokes.    You may have had some testing to help find out the cause of your paresthesias.    We still do not know the cause of your paresthesias. However, it is OK for you to go home. You may need more tests to figure out the cause.    See your primary care doctor or the referral specialist for more work-up and management of your paresthesias.    YOU SHOULD SEEK MEDICAL ATTENTION IMMEDIATELY, EITHER HERE OR AT THE NEAREST EMERGENCY DEPARTMENT, IF ANY OF THE FOLLOWING OCCUR:   Your arms get weak, numb or paralyzed (can t move), especially on one side.   You have vision  loss, trouble speaking or problems thinking.   Your speech is abnormal or one side of your face droops.   You lose consciousness ("pass out") or almost lose consciousness.   You have numbness or tingling after a head, neck or back injury.   You feel very dizzy or like the room is spinning.   You have other concerns.           Headache    You have been treated for a headache.    Headaches are very common. Most of the time they are benign (not harmful). Some headaches can be very serious. Your headache appears to be benign. The doctor feels it is OK for you to go home.    If you continue to have headaches, or  if this headache does not resolve over the next few days, you should be evaluated by your regular doctor or a neurologist. Keep a "headache diary." This may help your doctor learn the cause of your headaches.    Take your headache medication as directed. This is especially important if your doctor has placed you on a daily medication to prevent headaches.    YOU SHOULD SEEK MEDICAL ATTENTION IMMEDIATELY, EITHER HERE OR AT THE NEAREST EMERGENCY DEPARTMENT, IF ANY OF THE FOLLOWING OCCURS:   Your headache gets worse.   You have a severe headache that occurs suddenly.   Your head pain is different from your normal headache.   You have a fever (temperature higher than 100.70F / 38C), especially with a stiff neck.   You feel numbness, tingling, or weakness in your arms or legs.   You pass out.   You have problems with your vision.   You vomit and have trouble taking medication or keeping it down.           Anxiety, Panic    You have been diagnosed with an anxiety attack.    You seem to have had an anxiety attack. There are many conditions that can cause symptoms like these. If this is the first time this has happened, follow-up with your regular doctor. You may need more testing to be sure there isn't another cause for your symptoms.    Anxiety causes very strong feelings of worry and fear. It may also cause chest pain or shortness of breath. You may feel like you have palpitations (a racing heart). You might feel numbness (like parts of your body are "asleep"), especially around the mouth and in the hands or feet.    Follow up with your counselor and family doctor. If you do not have an appointment in the next 2-3 days, call and make one. It is VERY IMPORTANT for your counselor and family doctor to know if you get worse.    YOU SHOULD SEEK MEDICAL ATTENTION IMMEDIATELY, EITHER HERE OR AT THE NEAREST EMERGENCY DEPARTMENT, IF ANY OF THE FOLLOWING OCCURS:   You have symptoms that you normally don't have  with your anxiety attacks.   You think of harming yourself (suicidal thoughts) or harming someone else.   You have symptoms you normally don't have and they last longer than normal or your medicine doesn t help. These include chest pain, passing out, feeling that your heart is racing or shortness of breath.   You have a fever (temperature higher than 100.70F / 38C).

## 2014-03-07 NOTE — ED Notes (Signed)
Redness to throat, seen by pmd 2 weeks ago, dx c strep and took full course of amoxicillin, posterior neck pain , dizziness episode today, intermittent chest pressure, feeling anxious

## 2014-03-07 NOTE — ED Notes (Signed)
MD aware of pt's VS

## 2014-03-08 LAB — ECG 12-LEAD
Atrial Rate: 71 {beats}/min
P Axis: 24 degrees
P-R Interval: 164 ms
Q-T Interval: 390 ms
QRS Duration: 94 ms
QTC Calculation (Bezet): 423 ms
R Axis: 9 degrees
T Axis: 17 degrees
Ventricular Rate: 71 {beats}/min

## 2014-07-12 ENCOUNTER — Emergency Department
Admission: EM | Admit: 2014-07-12 | Discharge: 2014-07-12 | Disposition: A | Discharge status: Home / Self Care | Payer: Commercial Managed Care - POS | Attending: Emergency Medicine | Admitting: Emergency Medicine

## 2014-07-12 ENCOUNTER — Emergency Department: Payer: Commercial Managed Care - POS

## 2014-07-12 DIAGNOSIS — R079 Chest pain, unspecified: Secondary | ICD-10-CM

## 2014-07-12 DIAGNOSIS — R0789 Other chest pain: Secondary | ICD-10-CM

## 2014-07-12 LAB — CBC AND DIFFERENTIAL
Basophils Absolute Automated: 0.02 10*3/uL (ref 0.00–0.20)
Basophils Automated: 0 %
Eosinophils Absolute Automated: 0.09 10*3/uL (ref 0.00–0.70)
Eosinophils Automated: 1 %
Hematocrit: 40 % (ref 37.0–47.0)
Hgb: 14.1 g/dL (ref 12.0–16.0)
Lymphocytes Absolute Automated: 2.6 10*3/uL (ref 0.50–4.40)
Lymphocytes Automated: 30 %
MCH: 30.5 pg (ref 28.0–32.0)
MCHC: 35.3 g/dL (ref 32.0–36.0)
MCV: 86.6 fL (ref 80.0–100.0)
MPV: 10.4 fL (ref 9.4–12.3)
Monocytes Absolute Automated: 0.6 10*3/uL (ref 0.00–1.20)
Monocytes: 7 %
Neutrophils Absolute: 5.5 10*3/uL (ref 1.80–8.10)
Neutrophils: 62 %
Platelets: 279 10*3/uL (ref 140–400)
RBC: 4.62 10*6/uL (ref 4.20–5.40)
RDW: 13 % (ref 12–15)
WBC: 8.81 10*3/uL (ref 3.50–10.80)

## 2014-07-12 LAB — COMPREHENSIVE METABOLIC PANEL
ALT: 24 U/L (ref 0–55)
AST (SGOT): 20 U/L (ref 5–34)
Albumin/Globulin Ratio: 1.3 (ref 0.9–2.2)
Albumin: 4 g/dL (ref 3.5–5.0)
Alkaline Phosphatase: 85 U/L (ref 37–106)
Anion Gap: 11 (ref 5.0–15.0)
BUN: 13 mg/dL (ref 7.0–19.0)
Bilirubin, Total: 0.3 mg/dL (ref 0.2–1.2)
CO2: 23 mEq/L (ref 22–29)
Calcium: 9.4 mg/dL (ref 8.5–10.5)
Chloride: 104 mEq/L (ref 100–111)
Creatinine: 0.9 mg/dL (ref 0.6–1.0)
Globulin: 3.1 g/dL (ref 2.0–3.6)
Glucose: 97 mg/dL (ref 70–100)
Potassium: 3.8 mEq/L (ref 3.5–5.1)
Protein, Total: 7.1 g/dL (ref 6.0–8.3)
Sodium: 138 mEq/L (ref 136–145)

## 2014-07-12 LAB — GFR: EGFR: >60

## 2014-07-12 LAB — TROPONIN I: Troponin I: <0.01 ng/mL (ref 0.00–0.09)

## 2014-07-12 LAB — URINE HCG QUALITATIVE: Urine HCG Qualitative: NEGATIVE

## 2014-07-12 MED ORDER — ALUM & MAG HYDROXIDE-SIMETH 200-200-20 MG/5ML PO SUSP
30.0000 mL | Freq: Once | ORAL | Status: AC
Start: 2014-07-12 — End: 2014-07-12
  Administered 2014-07-12: 30 mL via ORAL
  Filled 2014-07-12: qty 30

## 2014-07-12 MED ORDER — LIDOCAINE VISCOUS 2 % MT SOLN
10.0000 mL | Freq: Once | OROMUCOSAL | Status: AC
Start: 2014-07-12 — End: 2014-07-12
  Administered 2014-07-12: 10 mL via OROMUCOSAL
  Filled 2014-07-12: qty 15

## 2014-07-12 NOTE — ED Provider Notes (Signed)
Physician/Midlevel provider first contact with patient: 07/12/14 [facility]         EMERGENCY DEPARTMENT HISTORY AND PHYSICAL EXAM     Physician/Midlevel provider first contact with patient: 07/12/14 1923         Date: 07/12/2014  Patient Name: Natalie Wolf    History of Presenting Illness     Chief Complaint   Patient presents with   . Chest Pain       History Provided By: patient    Chief Complaint: chest pain  Onset: 1 week ago  Timing: constant, worsening  Location: substernal, left and right chest  Quality: aching, burning  Severity: moderate  Modifying Factors:  Worse with eating (especially spicy foods), laying down   Associated Symptoms: none    Additional History: Natalie Wolf is a 34 y.o. female with 1 week of constant burning chest pain, worse with laying down, and after eating.  Not worse with exertion.  No SOB, no diaphoresis.  Has had URI with congestion and some cough, but minimal sputum production.  Sick contacts with mom. No h/o CAD, nonsmoker.    PCP: Pcp, Noneorunknown, MD      Current Facility-Administered Medications   Medication Dose Route Frequency Provider Last Rate Last Dose   . alum & mag hydroxide-simethicone (MAALOX PLUS) 200-200-20 mg/5 mL suspension 30 mL  30 mL Oral Once Ashley Jacobs, MD       . lidocaine viscous (XYLOCAINE) 2 % mouth solution 10 mL  10 mL Mouth/Throat Once Ashley Jacobs, MD         Current Outpatient Prescriptions   Medication Sig Dispense Refill   . lisinopril-hydrochlorothiazide (PRINZIDE,ZESTORETIC) 10-12.5 MG per tablet Take 1 tablet by mouth daily. 30 tablet 0   . LORazepam (ATIVAN) 1 MG tablet Take 1 tablet (1 mg total) by mouth every 8 (eight) hours as needed for Anxiety. 20 tablet 0   . norethindrone (MICRONOR) 0.35 MG tablet      . promethazine (PHENERGAN) 25 MG tablet Take 1 tablet (25 mg total) by mouth every 6 (six) hours as needed for Nausea. 20 tablet 0       Past History     Past Medical History:  Past Medical History    Diagnosis Date   . Hypertension        Past Surgical History:  Past Surgical History   Procedure Laterality Date   . Appendectomy         Family History:  History reviewed. No pertinent family history.    Social History:  History   Substance Use Topics   . Smoking status: Never Smoker    . Smokeless tobacco: Not on file   . Alcohol Use: Yes      Comment: social       Allergies:  No Known Allergies    Review of Systems     Review of Systems   Constitutional: Positive for chills. Negative for fever and diaphoresis.   Eyes: Negative for blurred vision.   Respiratory: Positive for cough. Negative for shortness of breath and wheezing.    Cardiovascular: Positive for chest pain. Negative for palpitations, orthopnea, claudication, leg swelling and PND.   Gastrointestinal: Positive for heartburn. Negative for nausea and vomiting.   Musculoskeletal: Negative for myalgias.   Skin: Negative for rash.   Neurological: Positive for headaches (seeing neurologist for headache). Negative for dizziness.   Psychiatric/Behavioral: Negative for depression.       Physical Exam   BP 132/81  mmHg  Pulse 88  Temp(Src) 98.3 F (36.8 C) (Oral)  Ht 5\' 6"  (1.676 m)  Wt 104.327 kg  BMI 37.14 kg/m2  SpO2 97%  LMP 07/07/2014    Physical Exam   Constitutional: She is oriented to person, place, and time and well-developed, well-nourished, and in no distress.   HENT:   Head: Normocephalic.   Eyes: Pupils are equal, round, and reactive to light.   Neck: Normal range of motion.   Cardiovascular: Normal rate and regular rhythm.    Pulmonary/Chest: Effort normal and breath sounds normal.   Abdominal: Soft. Bowel sounds are normal.   Musculoskeletal: Normal range of motion.   Neurological: She is alert and oriented to person, place, and time. Gait normal. GCS score is 15.   Skin: Skin is warm and dry.   Psychiatric: Affect and judgment normal.   Nursing note and vitals reviewed.      Diagnostic Study Results     Labs -     Results     Procedure  Component Value Units Date/Time    Beta HCG, Drema [place], Urine [[id number]] Collected:  07/12/14 2020    Specimen Information:  Urine Updated:  07/12/14 2039     Urine HCG Qualitative Negative     Troponin I [[id number]] Collected:  07/12/14 1951    Specimen Information:  Blood Updated:  07/12/14 2017     Troponin I <0.01 ng/mL     Comprehensive metabolic panel [[id number]] Collected:  07/12/14 1951    Specimen Information:  Blood Updated:  07/12/14 2016     Glucose 97 mg/dL      BUN 22.0 mg/dL      Creatinine 0.9 mg/dL      Sodium 254 mEq/L      Potassium 3.8 mEq/L      Chloride 104 mEq/L      CO2 23 mEq/L      CALCIUM 9.4 mg/dL      Protein, Total 7.1 g/dL      Albumin 4.0 g/dL      AST (SGOT) 20 U/L      ALT 24 U/L      Alkaline Phosphatase 85 U/L      Bilirubin, Total 0.3 mg/dL      Globulin 3.1 g/dL      Albumin/Globulin Ratio 1.3      Anion Gap 11.0     GFR [[id number]] Collected:  07/12/14 1951     EGFR >60.0 Updated:  07/12/14 2016    CBC with differential [[id number]] Collected:  07/12/14 1951    Specimen Information:  Blood / Blood Updated:  07/12/14 2000     WBC 8.81 x10 3/uL      Hgb 14.1 g/dL      Hematocrit 07.3 %      Platelets 279 x10 3/uL      RBC 4.62 x10 6/uL      MCV 86.6 fL      MCH 30.5 pg      MCHC 35.3 g/dL      RDW 13 %      MPV 10.4 fL      Neutrophils 62 %      Lymphocytes Automated 30 %      Monocytes 7 %      Eosinophils Automated 1 %      Basophils Automated 0 %      Immature Granulocyte Unmeasured %      Neutrophils Absolute 5.50 x10 3/uL      Abs Lymph  Automated 2.60 x10 3/uL      Abs Mono Automated 0.60 x10 3/uL      Abs Eos Automated 0.09 x10 3/uL      Absolute Baso Automated 0.02 x10 3/uL      Absolute Immature Granulocyte Unmeasured x10 3/uL           Radiologic Studies -   Radiology Results (24 Hour)     Procedure Component Value Units Date/Time    Chest 2 Views [[id number]] Collected:  07/12/14 2049    Order Status:  Completed Updated:  07/12/14 2054    Narrative:      History: chest  painChest pain for a couple of days      Technique: PA and Lateral    Comparison: None.    Findings:  The lungs appear clear.  There is no pneumothorax.  The heart is normal in size.    The mediastinum is within normal limits.             Impression:       No active disease is seen in the chest.    Laurena Slimmer, MD   07/12/2014 8:49 PM        .      Medical Decision Making   I am the first provider for this patient.    I reviewed the vital signs, available nursing notes, past medical history, past surgical history, family history and social history.    Vital Signs-Reviewed the patient's vital signs.   Patient Vitals for the past 12 hrs:   BP Temp Pulse   07/12/14 1918 - 98.3 F (36.8 C) -   07/12/14 1916 132/81 mmHg - 88       Pulse Oximetry Analysis - Normal 97% on RA    Cardiac Monitor:  Rate: 88  Rhythm:  Normal Sinus Rhythm      EKG:  Interpreted by the EP.   Time Interpreted: 19:02   Rate: 83   Rhythm: Normal Sinus Rhythm    Interpretation: poor R wave progression in the precordial leads   Comparison: compared with EKG from Mar 07, 2014 no significant change    Old Medical Records: Old medical records.  Previous electrocardiograms.     ED Course: 2030 -  Some improvement after GI cocktail, but not much    Provider Notes: 34 yo female with constant chest pain x 1 week, worse with eating and lying down.  Nonischemic EKG, neg trop after 1 week of pain.  Normal CXR.  Doubt PNA, doubt cardiac cause, low risk for PE.  Most likely due to reflux.  Will d/c with PPI trial, PCP follow-up.    Procedures:    Core Measures:    Critical Care Time:     Diagnosis     Clinical Impression:   1. Other chest pain                Ashley Jacobs, MD  07/12/14 2116

## 2014-07-12 NOTE — ED Notes (Signed)
Intermittent CP x 1 week.  Pain described burning and pressure under L breast.  Pt notes increased gas and burping.  Pt also c/o head pressure and concerned she may have sinus infection.

## 2014-07-12 NOTE — Discharge Instructions (Signed)
Chest Pain of Unclear Etiology    You have been seen for chest pain. The cause of your pain is not yet known.    Your doctor has learned about your medical history, examined you, and checked any tests that were done. Still, it is unclear why you are having pain. The doctor thinks there is only a very small chance that your pain is caused by a life-threatening condition. Later, your primary care doctor might do more tests or check you again.    Sometimes chest pain is caused by a dangerous condition, like a heart attack, aorta injury, blood clot in the lung, or collapsed lung. It is unlikely that your pain is caused by a life-threatening condition if: Your chest pain lasts only a few seconds at a time; you are not short of breath, nauseated (sick to your stomach), sweaty, or lightheaded; your pain gets worse when you twist or bend; your pain improves with exercise or hard work.    Chest pain is serious. It is VERY IMPORTANT that you follow up with your regular doctor and seek medical attention immediately here or at the nearest Emergency Department if your symptoms become worse or they change.    YOU SHOULD SEEK MEDICAL ATTENTION IMMEDIATELY, EITHER HERE OR AT THE NEAREST EMERGENCY DEPARTMENT, IF ANY OF THE FOLLOWING OCCURS:   Your pain gets worse.   Your pain makes you short of breath, nauseated, or sweaty.   Your pain gets worse when you walk, go up stairs, or exert yourself.   You feel weak, lightheaded, or faint.   It hurts to breathe.   Your leg swells.   Your symptoms get worse or you have new symptoms or concerns.            You may take heartburn medications over the counter as needed, such as nexium pr prevacid

## 2014-07-16 LAB — ECG 12-LEAD
Atrial Rate: 83 {beats}/min
P Axis: 30 degrees
P-R Interval: 168 ms
Q-T Interval: 354 ms
QRS Duration: 82 ms
QTC Calculation (Bezet): 415 ms
R Axis: 14 degrees
T Axis: 20 degrees
Ventricular Rate: 83 {beats}/min

## 2015-03-15 ENCOUNTER — Other Ambulatory Visit: Payer: Self-pay | Admitting: Gastroenterology

## 2015-03-15 DIAGNOSIS — R102 Pelvic and perineal pain: Secondary | ICD-10-CM

## 2015-10-09 HISTORY — PX: OTHER SURGICAL HISTORY: SHX169

## 2015-10-29 ENCOUNTER — Encounter: Payer: Self-pay | Admitting: Gastroenterology

## 2015-11-21 ENCOUNTER — Ambulatory Visit: Payer: Self-pay | Admitting: Gastroenterology

## 2015-11-28 ENCOUNTER — Encounter: Payer: Self-pay | Admitting: Nurse Practitioner

## 2015-11-28 ENCOUNTER — Encounter (INDEPENDENT_AMBULATORY_CARE_PROVIDER_SITE_OTHER): Payer: Self-pay

## 2015-11-28 ENCOUNTER — Ambulatory Visit (INDEPENDENT_AMBULATORY_CARE_PROVIDER_SITE_OTHER): Payer: Managed Care, Other (non HMO) | Admitting: Nurse Practitioner

## 2015-11-28 VITALS — BP 134/93 | HR 74 | Temp 97.8°F | Ht 66.0 in | Wt 234.2 lb

## 2015-11-28 DIAGNOSIS — K219 Gastro-esophageal reflux disease without esophagitis: Secondary | ICD-10-CM | POA: Diagnosis not present

## 2015-11-28 DIAGNOSIS — R1030 Lower abdominal pain, unspecified: Secondary | ICD-10-CM

## 2015-11-28 MED ORDER — DICYCLOMINE HCL 10 MG PO CAPS: 10.0000 mg | ORAL_CAPSULE | Freq: Three times a day (TID) | ORAL | Status: DC

## 2015-11-28 NOTE — Assessment & Plan Note (Signed)
She describes an approximate 2 year history of moderate, constant lower abdominal/pelvic pain. She's had evaluations with GI in [place] including MRI and ultrasound. She is also had diagnostic laparoscopy with OB/GYN who did not find any pathology to correlate to her symptoms. We will request these reports at this time. Though she does not seem overtly constipated she does describe some straining with bowel movements. Possible constipation as a component to her symptoms. I'll check a flat plate abdominal x-ray to assess for stool burden. If there is no constipation noted on x-ray can consider Bentyl 10 mg 3 times a day and at bedtime as needed for abdominal pain. Once we have gathered all her previous records as well as diagnostic imaging today we will have her return in 6 weeks to follow-up.

## 2015-11-28 NOTE — Assessment & Plan Note (Signed)
Patient has rare GERD symptoms which are living to dietary indiscretion. Well controlled with when necessary antacids. I doubt this is contributing to her pain. Continue to monitor and treat as necessary.

## 2015-11-28 NOTE — Patient Instructions (Addendum)
1. Have x-ray done when you're able to. 2. We will request your previous imaging including MRI and ultrasound. 3. If the x-ray is normal, can start Bentyl which I sent into your pharmacy.  4. If you started taking Bentyl, take 10 mg pill 3 times a day with meals and at bedtime as needed for abdominal cramping. 5. Return for follow-up in 6 weeks.

## 2015-11-28 NOTE — Progress Notes (Signed)
Primary Care Physician:  Denny Levy, [place] Primary Gastroenterologist:  Dr. Oneida Alar  Chief Complaint  Patient presents with  . Abdominal Pain    HPI:   Raven Young is a 35 y.o. female who presents on referral from OB/GYN for abdominal pain. Per staff report patient is seen 2 other GIs within the past 1-2 years. Patient states 1 told her she is constipated and gave her Linzess which only proceeded to give her diarrhea and she stopped taking it. The other told her it was on her mind. She then saw her gynecologist who did a laparoscopic exploratory pelvic surgery for endometriosis. OB/GYN notes reviewed which states patient is doing well, continued abdominal cramping and pelvic pain with diagnostic laparoscopy showing no pathology on exam to explain the patient's pain. Was subsequently referred to our office for further evaluation. No labs or imaging could be found in our system.  Today she states she saw 2 GI providers in [place]. One told her she's constipated because she doesn't go to the bathroom every day, started her on Linzess which only caused diarrhea. A second provider "gave me a medication that cost $85" and offered in office EGD. She has had ultrasounds and MRI at the hospital in [place] and the ultrasound was at [facility]. Was told there was nothing there.   Her pain is lower abdomen/palvic region which radiates to between her legs/thighs and lower back. Pain is described as crampy and/or pressure. Pain is constant and 6/10 ("it's just annoying cause it's been 2 years). Sometimes worse with menstruation. No worsening with eating. Pain improves somewhat in her back after a bowel movement. Has a bowel movement every 2-3 days depending on diet, consistency varies on diet but "never loose" and "otherwise just normal." Does occasionally have to sit prolonged and strain. Majority of the time feels like she's emptied her bowel completely after a bowel movement. Denies  hematochezia, melena, N/V, fever, chills, unintentional weight loss. Rare GERD symptoms which is likely related to dietary intake and well controlled on prn antacids. Denies chest pain, dyspnea, dizziness, lightheadedness, syncope, near syncope. Denies any other upper or lower GI symptoms.  Past Medical History  Diagnosis Date  . Hypertension   . Abnormal Pap smear of vagina   . GERD (gastroesophageal reflux disease)     Past Surgical History  Procedure Laterality Date  . Appendectomy    . Knee surgery  90's  . Diagnostic laproscopy  10/09/15    negative exam to explain pelvic/abdominal pain    Current Outpatient Prescriptions  Medication Sig Dispense Refill  . lisinopril-hydrochlorothiazide (PRINZIDE,ZESTORETIC) 10-12.5 MG tablet TK 1 T PO  QD  3  . loratadine (CLARITIN) 10 MG tablet     . norethindrone (MICRONOR,CAMILA,ERRIN) 0.35 MG tablet      No current facility-administered medications for this visit.    Allergies as of 11/28/2015  . (No Known Allergies)    Family History  Problem Relation Age of Onset  . Hypertension Mother   . Hypertension Father   . Breast cancer Maternal Aunt   . Colon cancer Neg Hx   . Pancreatic cancer Maternal Grandfather     Social History   Social History  . Marital Status: Single    Spouse Name: N/A  . Number of Children: N/A  . Years of Education: N/A   Occupational History  . Not on file.   Social History Main Topics  . Smoking status: Former Smoker    Quit date: 06/30/2008  .  Smokeless tobacco: Never Used  . Alcohol Use: 0.0 oz/week    0 Standard drinks or equivalent per week     Comment: socially  . Drug Use: No  . Sexual Activity: Not on file   Other Topics Concern  . Not on file   Social History Narrative    Review of Systems: 10-point ROS negative except as per HPI.    Physical Exam: BP 134/93 mmHg  Pulse 74  Temp(Src) 97.8 F (36.6 C) (Oral)  Ht 5\' 6"  (1.676 m)  Wt 234 lb 3.2 oz (106.232 kg)  BMI  37.82 kg/m2  LMP 11/07/2015 General:   Obese female, alert and oriented. Pleasant and cooperative. Well-nourished and well-developed.  Head:  Normocephalic and atraumatic. Eyes:  Without icterus, sclera clear and conjunctiva pink.  Ears:  Normal auditory acuity. Cardiovascular:  S1, S2 present without murmurs appreciated. Extremities without clubbing or edema. Respiratory:  Clear to auscultation bilaterally. No wheezes, rales, or rhonchi. No distress.  Gastrointestinal:  +BS, obese but soft, non-tender to palpation, and non-distended. No HSM noted. No guarding or rebound. No masses appreciated.  Rectal:  Deferred  Musculoskalatal:  Symmetrical without gross deformities. Skin:  Intact without significant lesions or rashes. Neurologic:  Alert and oriented x4;  grossly normal neurologically. Psych:  Alert and cooperative. Normal mood and affect. Heme/Lymph/Immune: No excessive bruising noted.    11/28/2015 1:56 PM   Disclaimer: This note was dictated with voice recognition software. Similar sounding words can inadvertently be transcribed and may not be corrected upon review.

## 2015-12-03 NOTE — Progress Notes (Signed)
CC'D TO PCP °

## 2015-12-06 ENCOUNTER — Telehealth: Payer: Self-pay | Admitting: Gastroenterology

## 2015-12-06 NOTE — Telephone Encounter (Signed)
Please tell the patient her XRay looked good. No mass, no obstruction, no other acute abnormality. There si only minimal stool at the end of her colon so she's not overty constipated. Ask her if her abdominal pain is persistent. If so, can trial Be ntyl but would want her to also start colace and/or MiraLAX to prevent constipation from the Bentyl.

## 2015-12-06 NOTE — Telephone Encounter (Signed)
PATIENT CALLED INQUIRING ABOUT XRAY RESULTS FROM LAST WEEK.  [phone]

## 2015-12-06 NOTE — Telephone Encounter (Signed)
Ginger called [facility] and requested xray report.

## 2015-12-07 NOTE — Telephone Encounter (Signed)
Tried to call pt- NA- LMOM 

## 2015-12-07 NOTE — Telephone Encounter (Signed)
Pt is aware. Pt is still having abd pain.She is going to try the bentyl and colace

## 2015-12-11 NOTE — Telephone Encounter (Signed)
Noted  

## 2016-01-16 ENCOUNTER — Telehealth: Payer: Self-pay

## 2016-01-16 ENCOUNTER — Ambulatory Visit (INDEPENDENT_AMBULATORY_CARE_PROVIDER_SITE_OTHER): Payer: Managed Care, Other (non HMO) | Admitting: Gastroenterology

## 2016-01-16 ENCOUNTER — Encounter: Payer: Self-pay | Admitting: Gastroenterology

## 2016-01-16 VITALS — BP 133/90 | HR 78 | Temp 98.2°F | Ht 66.0 in | Wt 237.6 lb

## 2016-01-16 DIAGNOSIS — R1030 Lower abdominal pain, unspecified: Secondary | ICD-10-CM | POA: Diagnosis not present

## 2016-01-16 DIAGNOSIS — R1031 Right lower quadrant pain: Secondary | ICD-10-CM | POA: Insufficient documentation

## 2016-01-16 DIAGNOSIS — R1032 Left lower quadrant pain: Principal | ICD-10-CM

## 2016-01-16 MED ORDER — FENOPROFEN CALCIUM 200 MG PO CAPS: ORAL_CAPSULE | ORAL | Status: DC

## 2016-01-16 NOTE — Progress Notes (Signed)
cc'ed to pcp °

## 2016-01-16 NOTE — Telephone Encounter (Signed)
I called and informed the pharmacist, Elsie Amis.

## 2016-01-16 NOTE — Assessment & Plan Note (Signed)
CHRONIC FOR 2 YEARS. GYN WORKUP EXHAUSTED.  SEE DR. MATT TAYLOR.  TAKE FENOPROFEN DAILY.  CALL IN 7 DAYS IF SYMPTOMS ARE NOT IMPROVED.   ICE GROIN AREAS THREE TIMES A DAY FOR 10 MINS.  PLEASE CALL IN ONE MONTH IF YOUR SYMPTOMS ARE NOT IMPROVED.   FOLLOW UP IN 2 MOS.

## 2016-01-16 NOTE — Patient Instructions (Addendum)
SEE DR. Leola Brazil, [facility].  TAKE FENOPROFEN DAILY. PLEASE  CALL IN 7 DAYS IF YOUR SYMPTOMS ARE NOT IMPROVED.   ICE PACKS TO YOUR GROIN AREAS THREE TIMES A DAY FOR 10 MINS.  PLEASE CALL IN ONE MONTH IF YOUR SYMPTOMS ARE NOT IMPROVED.   FOLLOW UP IN 2 MOS.

## 2016-01-16 NOTE — Progress Notes (Signed)
   Subjective:    Patient ID: Raven Young, female    DOB: 1980/12/09, 35 y.o.   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 MAY RADIATE TO BACK AND BACK PAIN BE RELIEVED  BY BM. FIRST THOUGH IT WAS ENDOMETRIOSIS AND SWITCHED OCPs. HAD DIAGNOSTIC LAPAROSCOPY AND NOTHING SEEN.  BMs:Q1-2 DAYS. DOESN'T HELP PAIN FEELS LESS BLOATED. WOKE UP AND HAD PAIN. 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 NO HEAVY LIFTING. WORKS OUT: 3 DAYS([employer], CARDIO MAINLY). LAST YEAR WAS DOING MORE STRENUOUS EXERCISE. WENT TO [place] ONE DAY AND THOUGH SHE DID SOMETHING TO HER GROIN. STOPPED GOING TO [place] AT THIS POINT. NOTICES HER PAIN AT NIGHT. IF SHE LAYS DOWN MAY GO AWAY AND MAY NEED PILLOW BETWEEN HER LEGS. NO ASPIRIN, BC/GOODY POWDERS, IBUPROFEN/MOTRIN, OR NAPROXEN/ALEVE. NO GOODY'S IN OVER 6 MOS. RARE HEARTBURN DEPENDING ON WHAT SHE EATS. NO RECENT INJURY TO HER PELVIS. HAs ARE GOOD. STRESS NOW RELIEVED. COMPANY CONCERNED ABOUT ERGONOMICS. TOOK IBUPROFEN 800 MG BID BUT IT DIDN'T HELP A LOT BUT DID EASE THE PAIN SOMEWHAT.   PT DENIES FEVER, CHILLS, HEMATOCHEZIA, HEMATEMESIS, nausea, vomiting, HEMATURIA, DYSURIA, VAGINAL DISCHARGE, melena, diarrhea, CHEST PAIN, SHORTNESS OF BREATH, CHANGE IN BOWEL IN HABITS, constipation, problems swallowing, OR problems with sedation.    Past Medical History  Diagnosis Date  . Hypertension   . Abnormal Pap smear of vagina   . GERD (gastroesophageal reflux disease)    Past Surgical History  Procedure Laterality Date  . Appendectomy  IN HIGH SCHOOL  . Knee surgery  90's  . Diagnostic laproscopy  10/09/15    negative exam to explain pelvic/abdominal pain   No Known Allergies  Current Outpatient Prescriptions  Medication Sig Dispense Refill  . NORVASC 5 MG tablet 5 mg daily.    . BENTYL DAILY    . loratadine (CLARITIN) 10 MG tablet     . MICRONOR,CAMILA,ERRIN) 0.35  MG tablet     . PRINZIDE,ZESTORETIC 10-12.5 MG tablet      Family History  Problem Relation Age of Onset  . Hypertension Mother   . Hypertension Father   . Breast cancer Maternal Aunt   . Colon cancer Neg Hx   . Pancreatic cancer Maternal Grandfather    Review of Systems PER HPI OTHERWISE ALL SYSTEMS ARE NEGATIVE.    Objective:   Physical Exam  Constitutional: She is oriented to person, place, and time. She appears well-developed and well-nourished. No distress.  HENT:  Head: Normocephalic and atraumatic.  Mouth/Throat: Oropharynx is clear and moist. No oropharyngeal exudate.  Eyes: Pupils are equal, round, and reactive to light. No scleral icterus.  Neck: Normal range of motion. Neck supple.  Cardiovascular: Normal rate, regular rhythm and normal heart sounds.   Pulmonary/Chest: Effort normal and breath sounds normal. No respiratory distress.  Abdominal: Soft. Bowel sounds are normal. She exhibits no distension. There is no tenderness.  Musculoskeletal: She exhibits tenderness (bilateral inguinal CREASES, NO MASS PALPATED; MILD TENDERNESS IN UPPER QUADS BILATERALLY). She exhibits no edema.  Lymphadenopathy:    She has no cervical adenopathy.  Neurological: She is alert and oriented to person, place, and time.  NO FOCAL DEFICITS  Psychiatric: She has a normal mood and affect.  Vitals reviewed.     Assessment & Plan:

## 2016-01-16 NOTE — Telephone Encounter (Signed)
PLEASE CALL PHARMACY. CHANGE RX TO FENOPROFEN 300 MG TID PO FOR 10 DAYS #30, RFx1.

## 2016-01-16 NOTE — Telephone Encounter (Signed)
T/C from pharmacist at [facility], Fenoprofen capsules do not come in 200 mg, only 400 mg and cannot halved. Tablets come in 300 mg and can be halved but cannot get 400 mg. Please call pharmacist at [phone] to change Rx.

## 2016-01-30 ENCOUNTER — Encounter: Payer: Self-pay | Admitting: Gastroenterology

## 2020-03-13 NOTE — Progress Notes (Signed)
Referring Provider: Denny Levy, PA Primary Care Physician:  Denny Levy, [place] Primary GI Physician: Dr. Abbey Chatters  Chief Complaint  Patient presents with   Constipation   Abdominal Pain    HPI:   Raven Young is a 39 y.o. female presenting today for constipation.  History of GERD and lower abdominal pain.  Last seen in 2017 for evaluation of lower abdominal/bilateral groin pain.  Pain noted present for 2 years.  On exam, she had no abdominal tenderness.  Noted tenderness in the bilateral inguinal creases and mild tenderness in upper quads bilaterally. Prior abdominal x-ray with no significant stool burden.  Prior GYN evaluation exhausted including exploratory laparoscopic evaluation for endometriosis that was negative.  She was advised to take ibuprofen daily for 7 days and see Dr. Leola Brazil (chiropractor).   Today:  Constipation: Has had intermittent constipation in the past. Has tried Mag citrate and MiraLAX for constipation. Taking a detox tea on Mondays for constipation. May have a BM 2-3 times a week. These BMs feel incomplete and are hard. Has to strain. When she moves her bowels, she notes some improvement in pain. This started around June 2021. No brbpr. No black stools. Doesn't feel Linzess helped in the past. Pain is like an ache and is mild.   Feels bloated in the lower abdomen. She was treated with Flagyl for vaginal discharge which didn't affect symptoms. Saw Chiropractor which resolved historically. Seeing chiropractor twice a week, just started last week. Pain pretty much constant since June. Had to see chiropractor for a while prior to improvement last time. Pain wraps from her low back around. Gym with walking daily. Had been doing heaver exercising in February and decreased this when the pain started. Same as it was in 2017.   Maternal Grandfather passed from pancreatic cancer. Material uncle has prostate cancer. No FHx of colon cancer. No unintentional weight  loss. Trying to lose weight. Trying to work out routinely and watch what she eats. Thinks she has lost about 23lbs since January.   No urinary symptoms. No nausea or vomiting. No fever or chills. Takes omeprazole less than once a week for intermittent GERD.   Past Medical History:  Diagnosis Date   Abnormal Pap smear of vagina    GERD (gastroesophageal reflux disease)    Hypertension     Past Surgical History:  Procedure Laterality Date   APPENDECTOMY     DIAGNOSTIC LAPROSCOPY  10/09/15   negative exam to explain pelvic/abdominal pain   KNEE SURGERY  90's    Current Outpatient Medications  Medication Sig Dispense Refill   amLODipine (NORVASC) 5 MG tablet 10 mg daily.   0   norgestrel-ethinyl estradiol (LO/OVRAL) 0.3-30 MG-MCG tablet Take 1 tablet by mouth daily.     omeprazole (PRILOSEC) 20 MG capsule Take 20 mg by mouth daily as needed.     No current facility-administered medications for this visit.    Allergies as of 03/14/2020 - Review Complete 03/14/2020  Allergen Reaction Noted   Lisinopril Swelling 09/30/2017    Family History  Problem Relation Age of Onset   Hypertension Mother    Hypertension Father    Breast cancer Maternal Aunt    Pancreatic cancer Maternal Grandfather    Prostate cancer Maternal Uncle    Colon cancer Neg Hx     Social History   Socioeconomic History   Marital status: Single    Spouse name: Not on file   Number of children: Not on file  Years of education: Not on file   Highest education level: Not on file  Occupational History   Not on file  Tobacco Use   Smoking status: Former Smoker    Quit date: 06/30/2008    Years since quitting: 11.7   Smokeless tobacco: Never Used  Substance and Sexual Activity   Alcohol use: Yes    Alcohol/week: 0.0 standard drinks    Comment: socially   Drug use: No   Sexual activity: Not on file  Other Topics Concern   Not on file  Social History Narrative   Not on file     Social Determinants of Health   Financial Resource Strain:    Difficulty of Paying Living Expenses: Not on file  Food Insecurity:    Worried About [place] in the Last Year: Not on file   Ran Out of Food in the Last Year: Not on file  Transportation Needs:    Lack of Transportation (Medical): Not on file   Lack of Transportation (Non-Medical): Not on file  Physical Activity:    Days of Exercise per Week: Not on file   Minutes of Exercise per Session: Not on file  Stress:    Feeling of Stress : Not on file  Social Connections:    Frequency of Communication with Friends and Family: Not on file   Frequency of Social Gatherings with Friends and Family: Not on file   Attends Religious Services: Not on file   Active Member of Clubs or Organizations: Not on file   Attends Archivist Meetings: Not on file   Marital Status: Not on file    Review of Systems: Gen: Denies fever, chills, cold symptoms, presyncope, syncope. CV: Denies chest pain or palpitations. Resp: Denies dyspnea or cough. GI: See HPI GU: Denies dysuria or hematuria. Heme: See HPI  Physical Exam: BP (!) 142/88    Pulse 82    Temp (!) 97.3 F (36.3 C) (Temporal)    Ht 5\' 6"  (1.676 m)    Wt 221 lb (100.2 kg)    LMP 03/02/2020 (Exact Date)    BMI 35.67 kg/m  General:   Alert and oriented. No distress noted. Pleasant and cooperative.  Head:  Normocephalic and atraumatic. Eyes:  Conjuctiva clear without scleral icterus. Heart:  S1, S2 present without murmurs appreciated. Lungs:  Clear to auscultation bilaterally. No wheezes, rales, or rhonchi. No distress.  Abdomen:  +BS, soft, and non-distended. Mild TTP in the very low abdominal/pelvic area. Also with some pain in the upper thighs. No CVA tenderness. No rebound or guarding. No HSM or masses noted.  Msk:  Symmetrical without gross deformities. Normal posture. Extremities:  Without edema. Neurologic:  Alert and  oriented x4 Psych:   Normal mood and affect.

## 2020-03-14 ENCOUNTER — Encounter: Payer: Self-pay | Admitting: Gastroenterology

## 2020-03-14 ENCOUNTER — Other Ambulatory Visit: Payer: Self-pay

## 2020-03-14 ENCOUNTER — Ambulatory Visit: Payer: Managed Care, Other (non HMO) | Admitting: Gastroenterology

## 2020-03-14 ENCOUNTER — Encounter: Payer: Self-pay | Admitting: Internal Medicine

## 2020-03-14 VITALS — BP 142/88 | HR 82 | Temp 97.3°F | Ht 66.0 in | Wt 221.0 lb

## 2020-03-14 DIAGNOSIS — R1030 Lower abdominal pain, unspecified: Secondary | ICD-10-CM

## 2020-03-14 DIAGNOSIS — K5900 Constipation, unspecified: Secondary | ICD-10-CM | POA: Diagnosis not present

## 2020-03-14 NOTE — Assessment & Plan Note (Addendum)
Patient notes return of lower abdominal/pelvic pain in June 2021.  Upon further discussion, pain is actually in her low back wrapping around her side and into her lower abdomen/pelvic area.  She also notes mild pain in her upper thighs as well.  No urinary symptoms. History of similar pain back in 2016/2017 with evaluation including pelvic US, pelvic MRI, X-ray, and exploratory laparoscopic evaluation for endometriosis that was negative. Ultimately pain resolved after seeing chiropractor. She started seeing the chiropractor again last week. No improvement as of yet. Notes exercising heavily in February but started walking only in June when the pain returned. Also reporting more persistent constipation that also started in June.  Denies brbpr, melena, or unintentional weight loss. Exam with mild TTP in the very low abdomen/pelvic area bilaterally. No CVA tenderness.   Suspect possible MSK etiology. Constipation may also be playing a role.   Plan:  MiraLAX 1 capful (17 grams) daily in 8 ounces of water. Add Benefiber or Metamucil daily. Continue seeing chiropractor.  Be sure to stretch well before and after exercising.  Tylenol or ibuprofen for pain for now.  Requested progress report in 2-4 weeks and to call with any worsening symptoms. Consider CT if pain continues.  Follow-up in 2 months.

## 2020-03-14 NOTE — Progress Notes (Signed)
Cc'ed to pcp °

## 2020-03-14 NOTE — Assessment & Plan Note (Addendum)
Intermittent constipation in the past. Feels constipation has been more persistent since June 2021 with hard, incomplete bowel movements 2-3 times a week, not on a regular bowel regimen.  No BRBPR, melena, or unintentional weight loss.  She does note return of lower abdominal/pelvic pain as discussed below.  Constipation may be contributing to her lower abdominal/pelvic pain, but I am not sure this is the only etiology (see below).   Plan:  MiraLAX 1 capful (17 grams) daily in 8 ounces of water. Add Benefiber or Metamucil daily. Requested progress report in 2 weeks.  If MiraLAX does not work well, we can try Linzess. Follow-up in 2 months.

## 2020-03-14 NOTE — Patient Instructions (Signed)
Please start MiraLAX 1 capful (17g) in 8 oz of water daily for constipation.   Add benefiber or metamucil daily.   Continue following with your chiropractor.  Be sure you are stretching well before and after your daily exercising.   You may try tylenol or ibuprofen for your discomfort.   Please call in 2-4 weeks with a progress report. Let me know of any worsening symptoms. If MiraLAX doesn't work well, please let me know and we can try Linzess.   Aliene Altes, PA-C [facility]

## 2020-04-11 NOTE — Progress Notes (Addendum)
[facility]    Provider:  Dr Raven Young Requesting Provider: Denny Young, [place] Primary Care Provider:  Denny Young, [place]  CC:  Persistent headaches  HPI:  Raven Young is a 39 y.o. female here as requested by Raven Levy, PA for persistent headaches.  Past medical history headaches and obesity and hypertension.  I reviewed Raven Young notes: Patient was last seen February 09, 2020, she stopped taking metoprolol on July 30 as her headaches were getting better and she had noticed some improvement, she still reported headaches, she is taking amlodipine, she is working on diet and exercise, examination in the office was unremarkable including eyes, neck, respiratory, cardiovascular.  I reviewed emergency room notes from August 22 where she presented for high blood pressure, persistent headache all day long with numbness of the left arm, having the symptoms for months in the setting of high blood pressure, she had not taken her blood pressure medication that day, blood pressure was 144/95.  I reviewed CT of the head and cervical spine report which showed: No evidence of acute intracranial pathology, no acute fracture or subluxation of the cervical spine.  She was administered amlodipine and dexamethasone.  CBC in February 2021 was unremarkable, BMP in February 2021 showed creatinine 0.87, BUN 15, sodium 135, potassium 3.3, normal glucose and CO2.  Noticed headaches started in February, prior to that she did have episodes of headaches and had scans in the past (in 2017) but worsening since this February. She started trying to eat better. She saw Ms Raven Young, she was treated with blood pressure medications but didn't help. In June she started noticing left arm tingling sensation. The entire arm and all the fingers, she had a headache, she went to the hospital. Pressure, head feels heavy, it moves, she has them on top of the head and in the back of the head, she went to a  chiropractor which helped tremendously after adjusting the neck but the headaches didn't help on top of the head. No light or sound sensitivity. 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 Npo nausea with the headaches. Since she went to the chiropractor she does not wake up with the headaches. It has helped with the headaches. Just pressure on the head. Pain in the neck which resolved. Unclear if these are similar to the past. CT of the head and neck recently did not help. She had a sleep apnea test at home and in the hospital and she was told she had sleep apnea. A year ago. She had it in [place]. We will see if we can find that and if appropriate sleep evaluation with Dr. Brett Young. No other focal neurologic deficits, associated symptoms, inciting events or modifiable factors.  Reviewed notes, labs and imaging from outside physicians, which showed: see above  Review of Systems: Patient complains of symptoms per HPI as well as the following symptoms: headache. Pertinent negatives and positives per HPI. All others negative.   Social History   Socioeconomic History  . Marital status: Single    Spouse name: Not on file  . Number of children: Not on file  . Years of education: Not on file  . Highest education level: Not on file  Occupational History  . Not on file  Tobacco Use  . Smoking status: Never Smoker  . Smokeless tobacco: Never Used  Substance and Sexual Activity  .  Alcohol use: Yes    Alcohol/week: 0.0 standard drinks    Comment: socially  . Drug use: No  . Sexual activity: Not on file  Other Topics Concern  . Not on file  Social History Narrative   Lives at home with daughter. Drinks one cup of caffeine a week. Right handed.    Social Determinants of Health   Financial Resource Strain:   . Difficulty of Paying Living Expenses: Not on file  Food Insecurity:     . Worried About Charity fundraiser in the Last Year: Not on file  . Ran Out of Food in the Last Year: Not on file  Transportation Needs:   . Lack of Transportation (Medical): Not on file  . Lack of Transportation (Non-Medical): Not on file  Physical Activity:   . Days of Exercise per Week: Not on file  . Minutes of Exercise per Session: Not on file  Stress:   . Feeling of Stress : Not on file  Social Connections:   . Frequency of Communication with Friends and Family: Not on file  . Frequency of Social Gatherings with Friends and Family: Not on file  . Attends Religious Services: Not on file  . Active Member of Clubs or Organizations: Not on file  . Attends Archivist Meetings: Not on file  . Marital Status: Not on file  Intimate Partner Violence:   . Fear of Current or Ex-Partner: Not on file  . Emotionally Abused: Not on file  . Physically Abused: Not on file  . Sexually Abused: Not on file    Family History  Problem Relation Age of Onset  . Hypertension Mother   . Hypertension Father   . Breast cancer Maternal Aunt   . Pancreatic cancer Maternal Grandfather   . Prostate cancer Maternal Uncle   . Other Maternal Grandmother        brain tumor  . Schizophrenia Paternal Grandmother   . Stroke Paternal Grandfather   . Colon cancer Neg Hx     Past Medical History:  Diagnosis Date  . Abnormal Pap smear of vagina   . GERD (gastroesophageal reflux disease)   . Hypertension   . Persistent headaches     Patient Active Problem List   Diagnosis Date Noted  . Chronic daily headache 04/17/2020  . Chronic neck pain 04/17/2020  . Constipation 03/14/2020  . Bilateral groin pain 01/16/2016  . Abdominal pain, lower 11/28/2015  . Esophageal reflux 11/28/2015    Past Surgical History:  Procedure Laterality Date  . APPENDECTOMY    . DIAGNOSTIC LAPROSCOPY  10/09/15   negative exam to explain pelvic/abdominal pain  . KNEE SURGERY  90's    Current Outpatient  Medications  Medication Sig Dispense Refill  . amLODipine (NORVASC) 10 MG tablet Take 10 mg by mouth daily.    . norgestrel-ethinyl estradiol (LO/OVRAL) 0.3-30 MG-MCG tablet Take 1 tablet by mouth daily.     No current facility-administered medications for this visit.    Allergies as of 04/13/2020 - Review Complete 04/13/2020  Allergen Reaction Noted  . Lisinopril Swelling 09/30/2017    Vitals: BP (!) 141/97   Pulse 97   Ht 5\' 6"  (1.676 m)   Wt 222 lb 6.4 oz (100.9 kg)   BMI 35.90 kg/m  Last Weight:  Wt Readings from Last 1 Encounters:  04/13/20 222 lb 6.4 oz (100.9 kg)   Last Height:   Ht Readings from Last 1 Encounters:  04/13/20 5\' 6"  (1.676 m)  Physical exam: Exam: Gen: NAD, conversant, well nourised, obese, well groomed                     CV: RRR, no MRG. No Carotid Bruits. No peripheral edema, warm, nontender Eyes: Conjunctivae clear without exudates or hemorrhage  Neuro: Detailed Neurologic Exam  Speech:    Speech is normal; fluent and spontaneous with normal comprehension.  Cognition:    The patient is oriented to person, place, and time;     recent and remote memory intact;     language fluent;     normal attention, concentration,     fund of knowledge Cranial Nerves:    The pupils are equal, round, and reactive to light. The fundi are flatt. Visual fields are full to finger confrontation. Extraocular movements are intact. Trigeminal sensation is intact and the muscles of mastication are normal. The face is symmetric. The palate elevates in the midline. Hearing intact. Voice is normal. Shoulder shrug is normal. The tongue has normal motion without fasciculations.   Coordination:    No dysmetria or ataxia   Gait:    Normal native gait  Motor Observation:    No asymmetry, no atrophy, and no involuntary movements noted. Tone:    Normal muscle tone.    Posture:    Posture is normal. normal erect    Strength:    Strength is V/V in the upper and  lower limbs.      Sensation: intact to LT     Reflex Exam:  DTR's: slightly brisker reflex     Deep tendon reflexes in the upper and lower extremities are symmetrical bilaterally.   Toes:    The toes are downgoing bilaterally.   Clonus:    Clonus is absent.    Assessment/Plan:  Chronic neck pain, arm sensory changes and weakness along with persistent chronic daily headaches.  Addendum 05/06/2020: CSF and opening pressures were unreamrkable. We discussed treatment for migraines today, gave her Ajovy samples and injected in clinic. Also feel she would benefit from MRI cervical spine,as we discussed no peripheral cause of her left arm symptoms were found on emg/ncs today and she has had > 3 months failed conservative therapy including heating, stretching, analgesics and chiropractic care  -  MRI cervical spine to look for myelopathy o radiculopathy - Emg/ncs to evaluate for CTS - She had a sleep apnea test at home and in the hospital and she was told she had sleep apnea. A year ago. She did not go back. She had it in [place]. We will see if we can find that and if appropriate sleep evaluation with Dr. Brett Young.  - headaches: MRI of the brain and orbits in the future as needed (CT of the head was negative, may consider MRI brain after evaluation if negative) - Lumbar puncture to evaluate for IIH   Orders Placed This Encounter  Procedures  . MR CERVICAL SPINE WO CONTRAST  . DG FLUORO GUIDED LOC OF NEEDLE/CATH TIP FOR SPINAL INJECT LT  . Ambulatory referral to Sleep Studies  . NCV with EMG(electromyography)   No orders of the defined types were placed in this encounter.  Discussed: To prevent or relieve headaches, try the following: Cool Compress. Lie down and place a cool compress on your head.  Avoid headache triggers. If certain foods or odors seem to have triggered your migraines in the past, avoid them. A headache diary might help you identify triggers.  Include  physical activity in  your daily routine. Try a daily walk or other moderate aerobic exercise.  Manage stress. Find healthy ways to cope with the stressors, such as delegating tasks on your to-do list.  Practice relaxation techniques. Try deep breathing, yoga, massage and visualization.  Eat regularly. Eating regularly scheduled meals and maintaining a healthy diet might help prevent headaches. Also, drink plenty of fluids.  Follow a regular sleep schedule. Sleep deprivation might contribute to headaches Consider biofeedback. With this mind-body technique, you learn to control certain bodily functions -- such as muscle tension, heart rate and blood pressure -- to prevent headaches or reduce headache pain.    Proceed to emergency room if you experience new or worsening symptoms or symptoms do not resolve, if you have new neurologic symptoms or if headache is severe, or for any concerning symptom.   Provided education and documentation from American headache Society toolbox including articles on: chronic migraine medication overuse headache, chronic migraines, prevention of migraines, behavioral and other nonpharmacologic treatments for headache.   Cc: Raven Young, [place],  [place], [place], [place]  Sarina Ill, [facility] [address] [place], [place] [address]  Phone [phone] Fax [phone]

## 2020-04-12 ENCOUNTER — Other Ambulatory Visit: Payer: Self-pay | Admitting: *Deleted

## 2020-04-12 ENCOUNTER — Encounter: Payer: Self-pay | Admitting: *Deleted

## 2020-04-13 ENCOUNTER — Other Ambulatory Visit: Payer: Self-pay

## 2020-04-13 ENCOUNTER — Ambulatory Visit: Payer: Managed Care, Other (non HMO) | Admitting: Neurology

## 2020-04-13 ENCOUNTER — Encounter: Payer: Self-pay | Admitting: Neurology

## 2020-04-13 VITALS — BP 141/97 | HR 97 | Ht 66.0 in | Wt 222.4 lb

## 2020-04-13 DIAGNOSIS — G959 Disease of spinal cord, unspecified: Secondary | ICD-10-CM | POA: Diagnosis not present

## 2020-04-13 DIAGNOSIS — G4733 Obstructive sleep apnea (adult) (pediatric): Secondary | ICD-10-CM

## 2020-04-13 DIAGNOSIS — M542 Cervicalgia: Secondary | ICD-10-CM | POA: Diagnosis not present

## 2020-04-13 DIAGNOSIS — R200 Anesthesia of skin: Secondary | ICD-10-CM | POA: Diagnosis not present

## 2020-04-13 DIAGNOSIS — R202 Paresthesia of skin: Secondary | ICD-10-CM

## 2020-04-13 DIAGNOSIS — M5412 Radiculopathy, cervical region: Secondary | ICD-10-CM

## 2020-04-13 DIAGNOSIS — G8929 Other chronic pain: Secondary | ICD-10-CM

## 2020-04-13 DIAGNOSIS — R519 Headache, unspecified: Secondary | ICD-10-CM

## 2020-04-13 NOTE — Patient Instructions (Addendum)
- MRI cervical spine - EMG/NCS - She had a sleep apnea test at home and in the hospital and she was told she had sleep apnea. A year ago. She did not go back. She had it in [place]. We will see if we can find that and if appropriate sleep evaluation with Dr. Brett Fairy.  - IDIOPATHIC INTRACRANIAL HYPERTENSION? Lumbar puncture - If negative can consider repeat MRI brain and orbits/eyes.    Idiopathic Intracranial Hypertension  Idiopathic intracranial hypertension (IIH) is a condition that increases pressure around the brain. The fluid that surrounds the brain and spinal cord (cerebrospinal fluid, CSF) increases and causes the pressure. Idiopathic means that the cause of this condition is not known. IIH affects the brain and spinal cord (is a neurological disorder). If this condition is not treated, it can cause vision loss or blindness. What increases the risk? You are more likely to develop this condition if:  You are severely overweight (obese).  You are a woman who has not gone through menopause.  You take certain medicines, such as birth control or steroids. What are the signs or symptoms? Symptoms of IIH include:  Headaches. This is the most common symptom.  Pain in the shoulders or neck.  Nausea and vomiting.  A "rushing water" or pulsing sound within the ears (pulsatile tinnitus).  Double vision.  Blurred vision.  Brief episodes of complete vision loss. How is this diagnosed? This condition may be diagnosed based on:  Your symptoms.  Your medical history.  CT scan of the brain.  MRI of the brain.  Magnetic resonance venogram (MRV) to check veins in the brain.  Diagnostic lumbar puncture. This is a procedure to remove and examine a sample of cerebrospinal fluid. This procedure can determine whether too much fluid may be causing IIH.  A thorough eye exam to check for swelling or nerve damage in the eyes. How is this treated? Treatment for this  condition depends on your symptoms. The goal of treatment is to decrease the pressure around your brain. Common treatments include:  Medicines to decrease the production of spinal fluid and lower the pressure within your skull.  Medicines to prevent or treat headaches.  Surgery to place drains (shunts) in your brain to remove excess fluid.  Lumbar puncture to remove excess cerebrospinal fluid. Follow these instructions at home:  If you are overweight or obese, work with your health care provider to lose weight.  Take over-the-counter and prescription medicines only as told by your health care provider.  Do not drive or use heavy machinery while taking medicines that can make you sleepy.  Keep all follow-up visits as told by your health care provider. This is important. Contact a health care provider if:  You have changes in your vision, such as: ? Double vision. ? Not being able to see colors (color vision). Get help right away if:  You have any of the following symptoms and they get worse or do not get better. ? Headaches. ? Nausea. ? Vomiting. ? Vision changes or difficulty seeing. Summary  Idiopathic intracranial hypertension (IIH) is a condition that increases pressure around the brain. The cause is not known (is idiopathic).  The most common symptom of IIH is headaches.  Treatment may include medicines or surgery to relieve the pressure on your brain. This information is not intended to replace advice given to you by your health care provider. Make sure you discuss any questions you have with your health care provider. Document Revised:  05/29/2017 Document Reviewed: 05/07/2016 Elsevier Patient Education  2020 [place] is a test to check how well your muscles and nerves are working. This procedure includes the combined use of electromyogram (EMG) and nerve conduction study (NCS). EMG is used to look for muscular  disorders. NCS, which is also called electroneurogram, measures how well your nerves are controlling your muscles. The procedures are usually done together to check if your muscles and nerves are healthy. If the results of the tests are abnormal, this may indicate disease or injury, such as a neuromuscular disease or peripheral nerve damage. Tell a health care provider about:  Any allergies you have.  All medicines you are taking, including vitamins, herbs, eye drops, creams, and over-the-counter medicines.  Any problems you or family members have had with anesthetic medicines.  Any blood disorders you have.  Any surgeries you have had.  Any medical conditions you have.  If you have a pacemaker.  Whether you are pregnant or may be pregnant. What are the risks? Generally, this is a safe procedure. However, problems may occur, including:  Infection where the electrodes were inserted.  Bleeding. What happens before the procedure? Medicines Ask your health care provider about:  Changing or stopping your regular medicines. This is especially important if you are taking diabetes medicines or blood thinners.  Taking medicines such as aspirin and ibuprofen. These medicines can thin your blood. Do not take these medicines unless your health care provider tells you to take them.  Taking over-the-counter medicines, vitamins, herbs, and supplements. General instructions  Your health care provider may ask you to avoid: ? Beverages that have caffeine, such as coffee and tea. ? Any products that contain nicotine or tobacco. These products include cigarettes, e-cigarettes, and chewing tobacco. If you need help quitting, ask your health care provider.  Do not use lotions or creams on the same day that you will be having the procedure. What happens during the procedure? For EMG   Your health care provider will ask you to stay in a position so that he or she can access the muscle that will  be studied. You may be standing, sitting, or lying down.  You may be given a medicine that numbs the area (local anesthetic).  A very thin needle that has an electrode will be inserted into your muscle.  Another small electrode will be placed on your skin near the muscle.  Your health care provider will ask you to continue to remain still.  The electrodes will send a signal that tells about the electrical activity of your muscles. You may see this on a monitor or hear it in the room.  After your muscles have been studied at rest, your health care provider will ask you to contract or flex your muscles. The electrodes will send a signal that tells about the electrical activity of your muscles.  Your health care provider will remove the electrodes and the electrode needles when the procedure is finished. The procedure may vary among health care providers and hospitals. For NCS   An electrode that records your nerve activity (recording electrode) will be placed on your skin by the muscle that is being studied.  An electrode that is used as a reference (reference electrode) will be placed near the recording electrode.  A paste or gel will be applied to your skin between the recording electrode and the reference electrode.  Your nerve will be stimulated with a mild  shock. Your health care provider will measure how much time it takes for your muscle to react.  Your health care provider will remove the electrodes and the gel when the procedure is finished. The procedure may vary among health care providers and hospitals. What happens after the procedure?  It is up to you to get the results of your procedure. Ask your health care provider, or the department that is doing the procedure, when your results will be ready.  Your health care provider may: ? Give you medicines for any pain. ? Monitor the insertion sites to make sure that bleeding stops. Summary  Electromyoneurogram is a test to  check how well your muscles and nerves are working.  If the results of the tests are abnormal, this may indicate disease or injury.  This is a safe procedure. However, problems may occur, such as bleeding and infection.  Your health care provider will do two tests to complete this procedure. One checks your muscles (EMG) and another checks your nerves (NCS).  It is up to you to get the results of your procedure. Ask your health care provider, or the department that is doing the procedure, when your results will be ready. This information is not intended to replace advice given to you by your health care provider. Make sure you discuss any questions you have with your health care provider. Document Revised: 03/02/2018 Document Reviewed: 02/12/2018 Elsevier Patient Education  [place] tunnel syndrome is a condition that causes pain in your hand and arm. The carpal tunnel is a narrow area that is on the palm side of your wrist. Repeated wrist motion or certain diseases may cause swelling in the tunnel. This swelling can pinch the main nerve in the wrist (median nerve). What are the causes? This condition may be caused by:  Repeated wrist motions.  Wrist injuries.  Arthritis.  A sac of fluid (cyst) or abnormal growth (tumor) in the carpal tunnel.  Fluid buildup during pregnancy. Sometimes the cause is not known. What increases the risk? The following factors may make you more likely to develop this condition:  Having a job in which you move your wrist in the same way many times. This includes jobs like being a Software engineer or a Scientist, water quality.  Being a woman.  Having other health conditions, such as: ? Diabetes. ? Obesity. ? A thyroid gland that is not active enough (hypothyroidism). ? Kidney failure. What are the signs or symptoms? Symptoms of this condition include:  A tingling feeling in your fingers.  Tingling or a loss of feeling  (numbness) in your hand.  Pain in your entire arm. This pain may get worse when you bend your wrist and elbow for a long time.  Pain in your wrist that goes up your arm to your shoulder.  Pain that goes down into your palm or fingers.  A weak feeling in your hands. You may find it hard to grab and hold items. You may feel worse at night. How is this diagnosed? This condition is diagnosed with a medical history and physical exam. You may also have tests, such as:  Electromyogram (EMG). This test checks the signals that the nerves send to the muscles.  Nerve conduction study. This test checks how well signals pass through your nerves.  Imaging tests, such as X-rays, ultrasound, and MRI. These tests check for what might be the cause of your condition. How is this treated? This  condition may be treated with:  Lifestyle changes. You will be asked to stop or change the activity that caused your problem.  Doing exercise and activities that make bones and muscles stronger (physical therapy).  Learning how to use your hand again (occupational therapy).  Medicines for pain and swelling (inflammation). You may have injections in your wrist.  A wrist splint.  Surgery. Follow these instructions at home: If you have a splint:  Wear the splint as told by your doctor. Remove it only as told by your doctor.  Loosen the splint if your fingers: ? Tingle. ? Lose feeling (become numb). ? Turn cold and blue.  Keep the splint clean.  If the splint is not waterproof: ? Do not let it get wet. ? Cover it with a watertight covering when you take a bath or a shower. Managing pain, stiffness, and swelling   If told, put ice on the painful area: ? If you have a removable splint, remove it as told by your doctor. ? Put ice in a plastic bag. ? Place a towel between your skin and the bag. ? Leave the ice on for 20 minutes, 2-3 times per day. General instructions  Take over-the-counter and  prescription medicines only as told by your doctor.  Rest your wrist from any activity that may cause pain. If needed, talk with your boss at work about changes that can help your wrist heal.  Do any exercises as told by your doctor, physical therapist, or occupational therapist.  Keep all follow-up visits as told by your doctor. This is important. Contact a doctor if:  You have new symptoms.  Medicine does not help your pain.  Your symptoms get worse. Get help right away if:  You have very bad numbness or tingling in your wrist or hand. Summary  Carpal tunnel syndrome is a condition that causes pain in your hand and arm.  It is often caused by repeated wrist motions.  Lifestyle changes and medicines are used to treat this problem. Surgery may help in very bad cases.  Follow your doctor's instructions about wearing a splint, resting your wrist, keeping follow-up visits, and calling for help. This information is not intended to replace advice given to you by your health care provider. Make sure you discuss any questions you have with your health care provider. Document Revised: 10/23/2017 Document Reviewed: 10/23/2017 Elsevier Patient Education  [place].  Cervical Radiculopathy  Cervical radiculopathy means that a nerve in the neck (a cervical nerve) is pinched or bruised. This can happen because of an injury to the cervical spine (vertebrae) in the neck, or as a normal part of getting older. This can cause pain or loss of feeling (numbness) that runs from your neck all the way down to your arm and fingers. Often, this condition gets better with rest. Treatment may be needed if the condition does not get better. What are the causes?  A neck injury.  A bulging disk in your spine.  Muscle movements that you cannot control (muscle spasms).  Tight muscles in your neck due to overuse.  Arthritis.  Breakdown in the bones and joints of the spine (spondylosis) due to  getting older.  Bone spurs that form near the nerves in the neck. What are the signs or symptoms?  Pain. The pain may: ? Run from the neck to the arm and hand. ? Be very bad or irritating. ? Be worse when you move your neck.  Loss of feeling  or tingling in your arm or hand.  Weakness in your arm or hand, in very bad cases. How is this treated? In many cases, treatment is not needed for this condition. With rest, the condition often gets better over time. If treatment is needed, options may include:  Wearing a soft neck collar (cervical collar) for short periods of time, as told by your doctor.  Doing exercises (physical therapy) to strengthen your neck muscles.  Taking medicines.  Having shots (injections) in your spine, in very bad cases.  Having surgery. This may be needed if other treatments do not help. The type of surgery that is used depends on the cause of your condition. Follow these instructions at home: If you have a soft neck collar:  Wear it as told by your doctor. Remove it only as told by your doctor.  Ask your doctor if you can remove the collar for cleaning and bathing. If you are allowed to remove the collar for cleaning or bathing: ? Follow instructions from your doctor about how to remove the collar safely. ? Clean the collar by wiping it with mild soap and water and drying it completely. ? Take out any removable pads in the collar every 1-2 days. Wash them by hand with soap and water. Let them air-dry completely before you put them back in the collar. ? Check your skin under the collar for redness or sores. If you see any, tell your doctor. Managing pain      Take over-the-counter and prescription medicines only as told by your doctor.  If told, put ice on the painful area. ? If you have a soft neck collar, remove it as told by your doctor. ? Put ice in a plastic bag. ? Place a towel between your skin and the bag. ? Leave the ice on for 20 minutes,  2-3 times a day.  If using ice does not help, you can try using heat. Use the heat source that your doctor recommends, such as a moist heat pack or a heating pad. ? Place a towel between your skin and the heat source. ? Leave the heat on for 20-30 minutes. ? Remove the heat if your skin turns bright red. This is very important if you are unable to feel pain, heat, or cold. You may have a greater risk of getting burned.  You may try a gentle neck and shoulder rub (massage). Activity  Rest as needed.  Return to your normal activities as told by your doctor. Ask your doctor what activities are safe for you.  Do exercises as told by your doctor or physical therapist.  Do not lift anything that is heavier than 10 lb (4.5 kg) until your doctor tells you that it is safe. General instructions  Use a flat pillow when you sleep.  Do not drive while wearing a soft neck collar. If you do not have a soft neck collar, ask your doctor if it is safe to drive while your neck heals.  Ask your doctor if the medicine prescribed to you requires you to avoid driving or using heavy machinery.  Do not use any products that contain nicotine or tobacco, such as cigarettes, e-cigarettes, and chewing tobacco. These can delay healing. If you need help quitting, ask your doctor.  Keep all follow-up visits as told by your doctor. This is important. Contact a doctor if:  Your condition does not get better with treatment. Get help right away if:  Your pain gets worse  and is not helped with medicine.  You lose feeling or feel weak in your hand, arm, face, or leg.  You have a high fever.  You have a stiff neck.  You cannot control when you poop or pee (have incontinence).  You have trouble with walking, balance, or talking. Summary  Cervical radiculopathy means that a nerve in the neck is pinched or bruised.  A nerve can get pinched from a bulging disk, arthritis, an injury to the neck, or other  causes.  Symptoms include pain, tingling, or loss of feeling that goes from the neck into the arm or hand.  Weakness in your arm or hand can happen in very bad cases.  Treatment may include resting, wearing a soft neck collar, and doing exercises. You might need to take medicines for pain. In very bad cases, shots or surgery may be needed. This information is not intended to replace advice given to you by your health care provider. Make sure you discuss any questions you have with your health care provider. Document Revised: 05/07/2018 Document Reviewed: 05/07/2018 Elsevier Patient Education  2020 [employer].

## 2020-04-17 ENCOUNTER — Encounter: Payer: Self-pay | Admitting: Neurology

## 2020-04-17 DIAGNOSIS — M542 Cervicalgia: Secondary | ICD-10-CM | POA: Insufficient documentation

## 2020-04-17 DIAGNOSIS — R519 Headache, unspecified: Secondary | ICD-10-CM | POA: Insufficient documentation

## 2020-04-17 DIAGNOSIS — G8929 Other chronic pain: Secondary | ICD-10-CM | POA: Insufficient documentation

## 2020-04-18 ENCOUNTER — Telehealth: Payer: Self-pay | Admitting: Neurology

## 2020-04-18 NOTE — Telephone Encounter (Signed)
[employer] order sent to GI. They will obtain the auth and reach out to the patient to schedule.  

## 2020-04-25 NOTE — Telephone Encounter (Signed)
[employer] did not approve the MRI Cervical spine.  "Based on [employer] imaging guidelines there is no record that you had contact (in person, by phone, by mail or by messaging) with your doctor after a recent (within three months) six week trial of treatment that failed to improve your symptoms. Supported treatment include (but are not limited to) medications for swelling or pain, physical therapy, and/or oral or inject steroids"  There is an unlimited time frame for a peer to peer. The phone number for a peer to peer is ([phone] and the case number is [id number].

## 2020-04-25 NOTE — Telephone Encounter (Signed)
Noted, I spoke with the patient and informed her. She states she would like until her NCV/EMG appt on 05/03/20 to discuss PT with you.

## 2020-04-25 NOTE — Telephone Encounter (Signed)
Please rescind. Let patient know her insurance will not let her get an MRI cervical spine until she has PT and the emg/ncs. If she is ok with PT I can refer her and we can see her at EMG/NCS to discuss thanks

## 2020-04-27 ENCOUNTER — Ambulatory Visit
Admission: RE | Admit: 2020-04-27 | Discharge: 2020-04-27 | Disposition: A | Discharge status: Home / Self Care | Payer: Managed Care, Other (non HMO) | Source: Ambulatory Visit | Attending: Neurology | Admitting: Neurology

## 2020-04-27 ENCOUNTER — Other Ambulatory Visit: Payer: Self-pay

## 2020-04-27 DIAGNOSIS — R519 Headache, unspecified: Secondary | ICD-10-CM

## 2020-04-27 LAB — CSF CELL COUNT WITH DIFFERENTIAL
RBC Count, CSF: 0 cells/uL
WBC, CSF: 1 cells/uL (ref 0–5)

## 2020-04-27 LAB — PROTEIN, CSF: Total Protein, CSF: 18 mg/dL (ref 15–45)

## 2020-04-27 LAB — GLUCOSE, CSF: Glucose, CSF: 55 mg/dL (ref 40–80)

## 2020-04-27 NOTE — Discharge Instructions (Signed)

## 2020-05-03 ENCOUNTER — Ambulatory Visit (INDEPENDENT_AMBULATORY_CARE_PROVIDER_SITE_OTHER): Payer: Managed Care, Other (non HMO) | Admitting: Neurology

## 2020-05-03 ENCOUNTER — Ambulatory Visit: Payer: Managed Care, Other (non HMO) | Admitting: Neurology

## 2020-05-03 ENCOUNTER — Other Ambulatory Visit: Payer: Self-pay

## 2020-05-03 DIAGNOSIS — R29898 Other symptoms and signs involving the musculoskeletal system: Secondary | ICD-10-CM | POA: Diagnosis not present

## 2020-05-03 DIAGNOSIS — M5412 Radiculopathy, cervical region: Secondary | ICD-10-CM | POA: Diagnosis not present

## 2020-05-03 DIAGNOSIS — R200 Anesthesia of skin: Secondary | ICD-10-CM

## 2020-05-03 DIAGNOSIS — R202 Paresthesia of skin: Secondary | ICD-10-CM

## 2020-05-03 DIAGNOSIS — Z0289 Encounter for other administrative examinations: Secondary | ICD-10-CM

## 2020-05-03 DIAGNOSIS — M542 Cervicalgia: Secondary | ICD-10-CM | POA: Diagnosis not present

## 2020-05-03 DIAGNOSIS — G8929 Other chronic pain: Secondary | ICD-10-CM

## 2020-05-03 MED ORDER — AJOVY 225 MG/1.5ML ~~LOC~~ SOAJ: 225.0000 mg | SUBCUTANEOUS | 3 refills | Status: DC

## 2020-05-03 NOTE — Progress Notes (Signed)
See procedure note.

## 2020-05-03 NOTE — Patient Instructions (Signed)
Ajovy once monthly MRI cervical spine  Fremanezumab injection What is this medicine? FREMANEZUMAB (fre ma NEZ ue mab) is used to prevent migraine headaches. This medicine may be used for other purposes; ask your health care provider or pharmacist if you have questions. COMMON BRAND NAME(S): AJOVY What should I tell my health care provider before I take this medicine? They need to know if you have any of these conditions:  an unusual or allergic reaction to fremanezumab, other medicines, foods, dyes, or preservatives  pregnant or trying to get pregnant  breast-feeding How should I use this medicine? This medicine is for injection under the skin. You will be taught how to prepare and give this medicine. Use exactly as directed. Take your medicine at regular intervals. Do not take your medicine more often than directed. It is important that you put your used needles and syringes in a special sharps container. Do not put them in a trash can. If you do not have a sharps container, call your pharmacist or healthcare provider to get one. Talk to your pediatrician regarding the use of this medicine in children. Special care may be needed. Overdosage: If you think you have taken too much of this medicine contact a poison control center or emergency room at once. NOTE: This medicine is only for you. Do not share this medicine with others. What if I miss a dose? If you miss a dose, take it as soon as you can. If it is almost time for your next dose, take only that dose. Do not take double or extra doses. What may interact with this medicine? Interactions are not expected. This list may not describe all possible interactions. Give your health care provider a list of all the medicines, herbs, non-prescription drugs, or dietary supplements you use. Also tell them if you smoke, drink alcohol, or use illegal drugs. Some items may interact with your medicine. What should I watch for while using this  medicine? Tell your doctor or healthcare professional if your symptoms do not start to get better or if they get worse. What side effects may I notice from receiving this medicine? Side effects that you should report to your doctor or health care professional as soon as possible:  allergic reactions like skin rash, itching or hives, swelling of the face, lips, or tongue Side effects that usually do not require medical attention (report these to your doctor or health care professional if they continue or are bothersome):  pain, redness, or irritation at site where injected This list may not describe all possible side effects. Call your doctor for medical advice about side effects. You may report side effects to FDA at 1-800-FDA-1088. Where should I keep my medicine? Keep out of the reach of children. You will be instructed on how to store this medicine. Throw away any unused medicine after the expiration date on the label. NOTE: This sheet is a summary. It may not cover all possible information. If you have questions about this medicine, talk to your doctor, pharmacist, or health care provider.  2020 Elsevier/[employer] Standard (2017-03-16 17:22:56)

## 2020-05-03 NOTE — Progress Notes (Signed)
        Full Name: Raven Young Gender: Female MRN #: [id number] Date of Birth: 02-Nov-1980    Visit Date: 05/03/2020 09:48 Age: 39 Years Examining Physician: Sarina Ill, MD  Referring Physician: Denny Levy PA Height: 5 feet 6 inch Patient History: 222lbs  History: Left Arm Pain  Summary: EMG/NCS performed on the left upper extremity.All nerves and muscles (as indicated in the following tables) were within normal limits.      Conclusion: This is a normal study of the left upper extremity. Recommend MRI cervical spine.  Sarina Ill M.D.  [facility] [address], [place], [place] [address] Tel: [phone] Fax: [phone]  Verbal informed consent was obtained from the patient, patient was informed of potential risk of procedure, including bruising, bleeding, hematoma formation, infection, muscle weakness, muscle pain, numbness, among others.        [place]    Nerve / Sites Muscle Latency Ref. Amplitude Ref. Rel Amp Segments Distance Velocity Ref. Area    ms ms mV mV %  cm m/s m/s mVms  L Median - APB     Wrist APB 4.0 ?4.4 9.6 ?4.0 100 Wrist - APB 7   35.7     Upper arm APB 7.9  9.0  94.5 Upper arm - Wrist 22 56 ?49 35.3  L Ulnar - ADM     Wrist ADM 2.8 ?3.3 8.4 ?6.0 100 Wrist - ADM 7   28.2     B.Elbow ADM 6.5  8.1  96.9 B.Elbow - Wrist 21 57 ?49 28.2     A.Elbow ADM 8.3  7.9  97.4 A.Elbow - B.Elbow 10 57 ?49 27.9         SNC    Nerve / Sites Rec. Site Peak Lat Ref.  Amp Ref. Segments Distance Peak Diff Ref.    ms ms V V  cm ms ms  L Median, Ulnar - Transcarpal comparison     Median Palm Wrist 2.0 ?2.2 78 ?35 Median Palm - Wrist 8       Ulnar Palm Wrist 2.0 ?2.2 41 ?12 Ulnar Palm - Wrist 8          Median Palm - Ulnar Palm  -0.0 ?0.4  L Median - Orthodromic (Dig II, Mid palm)     Dig II Wrist 2.9 ?3.4 22 ?10 Dig II - Wrist 13    L Ulnar - Orthodromic, (Dig V, Mid palm)     Dig V Wrist 2.5 ?3.1 9 ?5 Dig V - Wrist 47              F  Wave    Nerve F Lat Ref.   ms ms  L Ulnar - ADM 27.9 ?32.0       EMG Summary Table    Spontaneous MUAP Recruitment  Muscle IA Fib PSW Fasc Other Amp Dur. Poly Pattern  L. Deltoid Normal None None None _______ Normal Normal Normal Normal  L. Triceps brachii Normal None None None _______ Normal Normal Normal Normal  L. Cervical paraspinals (low) Normal None None None _______ Normal Normal Normal Normal  L. Opponens pollicis Normal None None None _______ Normal Normal Normal Normal  L. First dorsal interosseous Normal None None None _______ Normal Normal Normal Normal  L. Pronator teres Normal None None None _______ Normal Normal Normal Normal

## 2020-05-04 ENCOUNTER — Other Ambulatory Visit: Payer: Managed Care, Other (non HMO)

## 2020-05-06 NOTE — Procedures (Signed)
        Full Name: Raven Young Gender: Female MRN #: [id number] Date of Birth: 05-29-1981    Visit Date: 05/03/2020 09:48 Age: 39 Years Examining Physician: Sarina Ill, MD  Referring Physician: Denny Levy PA Height: 5 feet 6 inch Patient History: 222lbs  History: Left Arm Pain  Summary: EMG/NCS performed on the left upper extremity.All nerves and muscles (as indicated in the following tables) were within normal limits.      Conclusion: This is a normal study of the left upper extremity. Recommend MRI cervical spine.  Sarina Ill M.D.  [facility] [address] [place], [place] [address] Tel: [phone] Fax: [phone]  Verbal informed consent was obtained from the patient, patient was informed of potential risk of procedure, including bruising, bleeding, hematoma formation, infection, muscle weakness, muscle pain, numbness, among others.        [place]    Nerve / Sites Muscle Latency Ref. Amplitude Ref. Rel Amp Segments Distance Velocity Ref. Area    ms ms mV mV %  cm m/s m/s mVms  L Median - APB     Wrist APB 4.0 ?4.4 9.6 ?4.0 100 Wrist - APB 7   35.7     Upper arm APB 7.9  9.0  94.5 Upper arm - Wrist 22 56 ?49 35.3  L Ulnar - ADM     Wrist ADM 2.8 ?3.3 8.4 ?6.0 100 Wrist - ADM 7   28.2     B.Elbow ADM 6.5  8.1  96.9 B.Elbow - Wrist 21 57 ?49 28.2     A.Elbow ADM 8.3  7.9  97.4 A.Elbow - B.Elbow 10 57 ?49 27.9         SNC    Nerve / Sites Rec. Site Peak Lat Ref.  Amp Ref. Segments Distance Peak Diff Ref.    ms ms V V  cm ms ms  L Median, Ulnar - Transcarpal comparison     Median Palm Wrist 2.0 ?2.2 78 ?35 Median Palm - Wrist 8       Ulnar Palm Wrist 2.0 ?2.2 41 ?12 Ulnar Palm - Wrist 8          Median Palm - Ulnar Palm  -0.0 ?0.4  L Median - Orthodromic (Dig II, Mid palm)     Dig II Wrist 2.9 ?3.4 22 ?10 Dig II - Wrist 13    L Ulnar - Orthodromic, (Dig V, Mid palm)     Dig V Wrist 2.5 ?3.1 9 ?5 Dig V - Wrist 31              F  Wave    Nerve F Lat Ref.   ms ms  L Ulnar - ADM 27.9 ?32.0       EMG Summary Table    Spontaneous MUAP Recruitment  Muscle IA Fib PSW Fasc Other Amp Dur. Poly Pattern  L. Deltoid Normal None None None _______ Normal Normal Normal Normal  L. Triceps brachii Normal None None None _______ Normal Normal Normal Normal  L. Cervical paraspinals (low) Normal None None None _______ Normal Normal Normal Normal  L. Opponens pollicis Normal None None None _______ Normal Normal Normal Normal  L. First dorsal interosseous Normal None None None _______ Normal Normal Normal Normal  L. Pronator teres Normal None None None _______ Normal Normal Normal Normal

## 2020-05-06 NOTE — Progress Notes (Signed)
History: 39 y.o. female here as requested by Denny Levy, PA for persistent headaches.  Past medical history headaches and obesity and hypertension. CSF and opening pressures were unreamrkable. We discussed treatment for migraines today, gave her Ajovy samples and injected in clinic. Also feel she would benefit from MRI cervical spine,as we discussed no peripheral cause of her left arm symptoms were found on emg/ncs today and she has had > 3 months failed conservative therapy including heating, stretching, analgesics and chiropractic care. .  Orders Placed This Encounter  Procedures  . MR CERVICAL SPINE WO CONTRAST   Meds ordered this encounter  Medications  . Fremanezumab-vfrm (AJOVY) 225 MG/1.5ML SOAJ    Sig: Inject 225 mg into the skin every 30 (thirty) days.    Dispense:  4.5 mL    Refill:  3    Patient has copay card; she can have medication regardless of insurance approval or copay amount.    I spent over 20 minutes of face-to-face and non-face-to-face time with patient on the  1. Left arm weakness   2. Numbness and tingling in left arm   3. Cervicalgia   4. Chronic neck pain   5. Cervical radiculopathy    diagnosis.  This included previsit chart review, lab review, study review, order entry, electronic health record documentation, patient education on the different diagnostic and therapeutic options, counseling and coordination of care, risks and benefits of management, compliance, or risk factor reduction. This does not include time spent on mg/ncs.

## 2020-05-12 NOTE — Progress Notes (Signed)
Referring Provider: Denny Levy, [place] Primary Care Physician:  Denny Levy, [place] Primary GI Physician: Dr. Abbey Chatters  Chief Complaint  Patient presents with  . Abdominal Pain    HPI:   Raven Young is a 39 y.o. female presenting today for follow-up of constipation and lower abdominal pain.  History of GERD and lower abdominal pain that radiates into her thighs that had previously been evaluated by GYN exhaustively including exploratory laparoscopic evaluation for endometriosis that was negative.  She was advised to see a chiropractor which significantly improved her symptoms.  She was last seen in our office 03/14/2020.  Reported history of mild intermittent constipation but had become more persistent since June.  Tried mag citrate and MiraLAX but continued with 2-3 BMs a week that were incomplete.  Reported Linzess was not helpful in the past. Had also had return of constant lower abdominal pain around June 2021 and felt bloated in her lower abdomen.  Noted pain actually wrapped from her lower back around her lower abdomen/pelvic area and into her upper thighs.  She had started exercising heavily in February with efforts to lose weight, but started walking only in June when the pain started.  Overall, symptoms felt the same as he had previously.  Some improvement with bowel movements.  Had just resumed seeing a chiropractor the week prior to her office visit.  Suspected possible MSK etiology as well as underlying constipation contributing to abdominal pain.  Plan to start MiraLAX daily, Benefiber or Metamucil daily, stretch well before and after exercising, continue seeing chiropractor, Tylenol or ibuprofen for pain, requested 2-week progress report with consideration of CT if pain continues.  Follow-up in 2 months.  No progress report received.  Patient has seen OB/GYN since her last visit and underwent ultrasound revealing 2.6 posterior uterine fibroid which was not felt to be  contributing to patient's pelvic pain.  Today:  Abdominal Pain: Abdominal pain is not as bad. Occasional discomfort after a BM for a few minutes but this resolves. BMs continue to move about 3 times a week. BMs are productive. This is her baseline. No blood in the stool or black stools. MiraLAX caused abdominal cramping. Linzess had also caused abdominal cramping in the past.   Pelvic pain continues. Constant. When sitting she notices pain in her hips. When laying down, she notices the pain in her groin as well as her upper thighs. Seeing the chiropractor about once a month. Had been seeing them 3 times a week. No improvement with seeing the chiropractor. Stopped exercising.   No nausea or vomiting. No GERD symptoms. Hasn't required a PPI in a long time. No dysphagia.   Past Medical History:  Diagnosis Date  . Abnormal Pap smear of vagina   . GERD (gastroesophageal reflux disease)   . Hypertension   . Persistent headaches     Past Surgical History:  Procedure Laterality Date  . APPENDECTOMY    . DIAGNOSTIC LAPROSCOPY  10/09/15   negative exam to explain pelvic/abdominal pain  . KNEE SURGERY  90's    Current Outpatient Medications  Medication Sig Dispense Refill  . amLODipine (NORVASC) 10 MG tablet Take 10 mg by mouth daily.    Marland Kitchen FIBER ADULT GUMMIES PO Take by mouth as needed.    . Fremanezumab-vfrm (AJOVY) 225 MG/1.5ML SOAJ Inject 225 mg into the skin every 30 (thirty) days. 4.5 mL 3  . Magnesium 400 MG TABS Take 3 tablets by mouth. Takes 4-5 times/week    . norgestrel-ethinyl  estradiol (LO/OVRAL) 0.3-30 MG-MCG tablet Take 1 tablet by mouth daily.    . Probiotic Product (PROBIOTIC DAILY PO) Take by mouth daily.     No current facility-administered medications for this visit.    Allergies as of 05/14/2020 - Review Complete 05/14/2020  Allergen Reaction Noted  . Lisinopril Swelling 09/30/2017    Family History  Problem Relation Age of Onset  . Hypertension Mother   .  Hypertension Father   . Breast cancer Maternal Aunt   . Pancreatic cancer Maternal Grandfather   . Prostate cancer Maternal Uncle   . Other Maternal Grandmother        brain tumor  . Schizophrenia Paternal Grandmother   . Stroke Paternal Grandfather   . Colon cancer Neg Hx     Social History   Socioeconomic History  . Marital status: Single    Spouse name: Not on file  . Number of children: Not on file  . Years of education: Not on file  . Highest education level: Not on file  Occupational History  . Not on file  Tobacco Use  . Smoking status: Never Smoker  . Smokeless tobacco: Never Used  Substance and Sexual Activity  . Alcohol use: Not Currently    Alcohol/week: 0.0 standard drinks    Comment: socially  . Drug use: No  . Sexual activity: Not on file  Other Topics Concern  . Not on file  Social History Narrative   Lives at home with daughter. Drinks one cup of caffeine a week. Right handed.    Social Determinants of Health   Financial Resource Strain:   . Difficulty of Paying Living Expenses: Not on file  Food Insecurity:   . Worried About Charity fundraiser in the Last Year: Not on file  . Ran Out of Food in the Last Year: Not on file  Transportation Needs:   . Lack of Transportation (Medical): Not on file  . Lack of Transportation (Non-Medical): Not on file  Physical Activity:   . Days of Exercise per Week: Not on file  . Minutes of Exercise per Session: Not on file  Stress:   . Feeling of Stress : Not on file  Social Connections:   . Frequency of Communication with Friends and Family: Not on file  . Frequency of Social Gatherings with Friends and Family: Not on file  . Attends Religious Services: Not on file  . Active Member of Clubs or Organizations: Not on file  . Attends Archivist Meetings: Not on file  . Marital Status: Not on file    Review of Systems: Gen: Denies fever, chills, cold or flulike symptoms, lightheadedness, dizziness,  presyncope, syncope. CV: Denies chest pain or palpitations. Resp: Denies dyspnea or cough. GI: See HPI Heme: See HPI  Physical Exam: BP (!) 137/95   Pulse 84   Temp (!) 97.1 F (36.2 C) (Temporal)   Ht 5\' 6"  (1.676 m)   Wt 226 lb 9.6 oz (102.8 kg)   LMP 05/05/2020 (Approximate)   BMI 36.57 kg/m  General:   Alert and oriented. No distress noted. Pleasant and cooperative.  Head:  Normocephalic and atraumatic. Eyes:  Conjuctiva clear without scleral icterus. Heart:  S1, S2 present without murmurs appreciated. Lungs:  Clear to auscultation bilaterally. No wheezes, rales, or rhonchi. No distress.  Abdomen:  +BS, soft, and non-distended. Mild TTP in LLQ. No rebound or guarding. No HSM or masses noted. Msk:  Symmetrical without gross deformities. Normal posture. Extremities:  Without edema. Neurologic:  Alert and  oriented x4 Psych:  Normal mood and affect.

## 2020-05-14 ENCOUNTER — Telehealth: Payer: Self-pay | Admitting: *Deleted

## 2020-05-14 ENCOUNTER — Ambulatory Visit: Payer: Managed Care, Other (non HMO) | Admitting: Gastroenterology

## 2020-05-14 ENCOUNTER — Encounter: Payer: Self-pay | Admitting: Gastroenterology

## 2020-05-14 ENCOUNTER — Other Ambulatory Visit: Payer: Self-pay

## 2020-05-14 VITALS — BP 137/95 | HR 84 | Temp 97.1°F | Ht 66.0 in | Wt 226.6 lb

## 2020-05-14 DIAGNOSIS — K5900 Constipation, unspecified: Secondary | ICD-10-CM

## 2020-05-14 DIAGNOSIS — R1030 Lower abdominal pain, unspecified: Secondary | ICD-10-CM | POA: Diagnosis not present

## 2020-05-14 NOTE — Assessment & Plan Note (Addendum)
39 year old female reporting ongoing daily lower abdominal/pelvic pain that began around June 2021 radiating to her groin and upper thighs. Symptoms are most present when sitting or laying down, improved with standing. Had also noted some worsening of constipation in June. Mild improvement in abdominal discomfort and constipation with fiber supplement and probiotic, but continues with daily pelvic pain radiating to her groin. She does have history of lower abdominal/pelvic pain in 2016/2017 with extensive evaluation including pelvic ultrasound, pelvic MRI, x-ray, and exploratory laparoscopic evaluation for endometriosis that was negative.  Ultimately, symptoms improved with seeing a chiropractor. She had reported increased exercise prior to onset of symptoms, but she has since stopped exercising and has been seeing a chiropractor without any improvement.  Pelvic ultrasound in October 2022 with OB/GYN (Care Everywhere) revealed 2.6 posterior uterine fibroid which was not felt to be contributing to her pelvic pain. Exam today with mild tenderness to palpation in LLQ.   I suspect etiology is likely MSK/neuropathic and may also be influenced by persistence of mild constipation as she is still only having BMs 3 times a week. Doubt smoldering diverticulitis, colitis, or other occult etiology including malignancy, but can't rule this out.  Notably, patient would like to have additional abdominal imaging for further evaluation.  Plan: CT A/P with contrast to evaluate abdominal pain further. Start Colace daily for mild constipation. Continue fiber supplement. Continue probiotic. May need MRI of lower back to evaluate for possible radicular pain. She has a neurologist and will discuss this with them after her CT is complete.  Follow-up in 3 months.

## 2020-05-14 NOTE — Telephone Encounter (Signed)
Novella Rob: [id number] (exp. 05/08/20 to 08/06/20) patient is scheduled at GI for 05/26/20.

## 2020-05-14 NOTE — Telephone Encounter (Signed)
Received fax from [facility] asking for [place] PA to be done. Need updated Dx code for Ajovy PA. Will request from Dr Jaynee Eagles. I called [facility] and spoke with staff and provided a savings card for Ajovy but it was rejected and couldn't not be processed. Pharmacy received message stating the medication has to be covered by insurance.

## 2020-05-14 NOTE — Assessment & Plan Note (Addendum)
Chronic history of mild intermittent constipation.  Patient reported she felt constipation became more persistent in June 2021 with incomplete bowel movements occurring 2-3 times a week.  She has started fiber Gummies and a probiotic which has helped with BM productivity but continues with about 3 BMs per week.  No BRBPR or melena.  She does have lower abdominal/pelvic pain and bloating which could be influenced by underlying constipation.  Reports prior trial of MiraLAX as well as Linzess caused abdominal cramping.  Plan: Start Colace daily. Continue daily fiber gummy. Continue probiotic. Avoid items that cause gas and bloating including broccoli, cauliflower, cabbage, Brussels sprouts, "roughage", beans, artificial sweeteners, carbonated beverages, drinking through a straw, chewing gum. Follow-up in 3 months.  Requested 2-week progress report.

## 2020-05-14 NOTE — Telephone Encounter (Signed)
PA pending review via [employer]icore for [employer]. Clinicals faxed. Ref# [id number]

## 2020-05-14 NOTE — Patient Instructions (Addendum)
Please have CT completed. We will call you with results.   Please start Colace daily to help with constipation.  Continue taking fiber supplement daily.  Continue taking daily probiotic.  Avoid items that cause gas and bloating including broccoli, cauliflower, cabbage, Brussels sprouts, "roughage", beans, artificial sweeteners, carbonated beverages, drinking through a straw, chewing gum.  We will see you back in 3 months. Please call in 2 weeks with a progress report on constipation.   Aliene Altes, PA-C [facility]

## 2020-05-15 NOTE — Telephone Encounter (Signed)
J33.545

## 2020-05-15 NOTE — Telephone Encounter (Signed)
Checked evicore and PA is still pending review

## 2020-05-16 NOTE — Telephone Encounter (Addendum)
Was able to pull up PA that BC,RN started from key: [id number]. PA Case ID: [id number]. Added dx code: P82.423 and answered clinical questions.  Submitted PA, waiting on determination from [place].

## 2020-05-17 NOTE — Telephone Encounter (Signed)
Received fax back from [place] that [place] denied. It is covered if she has tried 2 therapies from 2 different classes of medications: antiepileptic drugs (divalproex sodium, valproate, topiramate), antidepressants (amitriptyline, venlafaxine), betablockers (metoprolol, propranolol, timolol), or botox.   I only saw where pt had tried/failed: amlodipine, metoprolol, lisinopril.

## 2020-05-17 NOTE — Telephone Encounter (Signed)
Checked evicore and PA is still pending review

## 2020-05-18 NOTE — Telephone Encounter (Signed)
PA approved. Auth# [id number] DOS 05/14/2020-08/12/2020  CT scheduled for 11/26 at 8:30am, arrival 8:15am, npo 4 hrs prior, p/u oral contrast from [facility] pt and she has been provided with CS # to call if need to r/s

## 2020-05-21 NOTE — Telephone Encounter (Signed)
Will the Ajovy copay ard work?

## 2020-05-21 NOTE — Telephone Encounter (Signed)
I called the pharmacy to check on the savings card and I was told the patient already filled it on 05/20/20. I will follow-up again later if we receive any requests in the future.

## 2020-05-21 NOTE — Telephone Encounter (Signed)
I'll try to call pharmacy again and see if they can get it to process now.

## 2020-05-22 ENCOUNTER — Institutional Professional Consult (permissible substitution): Payer: Managed Care, Other (non HMO) | Admitting: Neurology

## 2020-05-25 ENCOUNTER — Ambulatory Visit (HOSPITAL_COMMUNITY): Payer: Managed Care, Other (non HMO)

## 2020-05-26 ENCOUNTER — Other Ambulatory Visit: Payer: Managed Care, Other (non HMO)

## 2020-06-01 ENCOUNTER — Other Ambulatory Visit: Payer: Self-pay

## 2020-06-01 ENCOUNTER — Ambulatory Visit
Admission: RE | Admit: 2020-06-01 | Discharge: 2020-06-01 | Disposition: A | Discharge status: Home / Self Care | Payer: Managed Care, Other (non HMO) | Source: Ambulatory Visit | Attending: Neurology | Admitting: Neurology

## 2020-06-01 DIAGNOSIS — M542 Cervicalgia: Secondary | ICD-10-CM | POA: Diagnosis not present

## 2020-06-01 DIAGNOSIS — R29898 Other symptoms and signs involving the musculoskeletal system: Secondary | ICD-10-CM

## 2020-06-01 DIAGNOSIS — G8929 Other chronic pain: Secondary | ICD-10-CM

## 2020-06-01 DIAGNOSIS — R200 Anesthesia of skin: Secondary | ICD-10-CM

## 2020-06-01 DIAGNOSIS — M5412 Radiculopathy, cervical region: Secondary | ICD-10-CM

## 2020-06-05 ENCOUNTER — Ambulatory Visit (HOSPITAL_COMMUNITY)
Admission: RE | Admit: 2020-06-05 | Discharge: 2020-06-05 | Disposition: A | Discharge status: Home / Self Care | Payer: Managed Care, Other (non HMO) | Source: Ambulatory Visit | Attending: Gastroenterology | Admitting: Gastroenterology

## 2020-06-05 ENCOUNTER — Telehealth: Payer: Self-pay

## 2020-06-05 ENCOUNTER — Other Ambulatory Visit: Payer: Self-pay

## 2020-06-05 DIAGNOSIS — R1030 Lower abdominal pain, unspecified: Secondary | ICD-10-CM | POA: Diagnosis not present

## 2020-06-05 LAB — POCT I-STAT CREATININE: Creatinine, Ser: 0.7 mg/dL (ref 0.44–1.00)

## 2020-06-05 MED ORDER — IOHEXOL 300 MG/ML  SOLN
100.0000 mL | Freq: Once | INTRAMUSCULAR | Status: AC | PRN
  Administered 2020-06-05: 100 mL via INTRAVENOUS

## 2020-06-05 NOTE — Telephone Encounter (Signed)
I am glad her abdominal pain has improved. Does she feel she is still constipated? Hard or incomplete Bms? Seems her bowel frequency has changed very much with colace daily compared to when she was seen in the office.   Regarding being "gassy": 1.  Is she still taking a probiotic daily? Align, digestive advantage, or [place] [facility] are good options. 2.  Increase water intake. Drink enough to keep urine pale yellow to clear.  3.  Avoid gas producing items including: Beans, broccoli, cauliflower, cabbage, Brussels sprouts, artificial sweeteners, carbonated beverages, drinking through a straw, and chewing gum. 4.  If she is already strictly avoiding the above items, she can try following a strict lactose free diet for a couple of weeks to see if this helps. Please mail handout for this.  5.  She can also use simethicone as needed or try Beano before meals.

## 2020-06-05 NOTE — Telephone Encounter (Signed)
Pt called with progress report. Pt has been taking Colace 100 mg qpm as directed at her last ov. Pt is having a BM qod-q two days, pt has no abdominal cramping like she previously had and pt reports being gassy most of the day. Pt does take fiber gummies as needed only due to not wanting to be overly gassy throughout the day.

## 2020-06-06 NOTE — Telephone Encounter (Signed)
Spoke with pt. Pt feels her BM's are much better. Pt was having a BM 1-2 times a week and she now has a BM qd-qod. Pt isn't taking her probiotic and was given a list of probiotics provided to try, pt advised to increase water intake, avoid items that cause bloating/gas, discussed trying a strict Lactose free diet if she is already avoid items mentioned that cause bloating/gas (information mailed to pt). Also discussed trying Beano or Simethicone if needed.

## 2020-06-06 NOTE — Telephone Encounter (Signed)
Noted  

## 2020-07-31 ENCOUNTER — Telehealth: Payer: Self-pay | Admitting: Neurology

## 2020-07-31 NOTE — Telephone Encounter (Signed)
Completed Ajovy PA on Cover My Meds. Key: [id number]. Awaiting determination from [employer] within 72-120 hours.

## 2020-07-31 NOTE — Telephone Encounter (Signed)
Pt request refill Fremanezumab-vfrm (AJOVY) 225 MG/1.5ML SOAJ at [facility] #[id number]

## 2020-07-31 NOTE — Telephone Encounter (Signed)
G43.709 

## 2020-08-06 ENCOUNTER — Telehealth: Payer: Self-pay | Admitting: Neurology

## 2020-08-06 NOTE — Telephone Encounter (Signed)
See other mychart message. I called the pharmacy and was told it's too early to pickup, pt due in 1 week. She should also be able to use the savings card.

## 2020-08-06 NOTE — Telephone Encounter (Signed)
Spoke with [facility] pharmacist. I was told the Ajovy went through pt's insurance in November for a 90 day supply and it was $24.99. I was told pt could fill this again in a week. It's too soon to fill right now.

## 2020-08-06 NOTE — Telephone Encounter (Signed)
Pt called, [employer] informed me any PA will be denied until receive a peer to peer from the physician. Would like a call from the nurse.

## 2020-08-06 NOTE — Telephone Encounter (Signed)
Deniedon February 3  CaseId:[id number];Status:Denied;Review Type:Prior Auth;Appeal Information: [place] [id number]; Important - Please read the below note on eAppeals: Please reference the denial letter for information on the rights for an appeal, rationale for the denial, and how to submit an appeal including if any information is needed to support the appeal. Note about urgent situations - Generally, an urgent situation is one which, in the opinion of the provider, the health of the patient may be in serious jeopardy or may experience pain that cannot be adequately controlled while waiting for a decision on the appeal.;

## 2020-08-14 NOTE — Progress Notes (Deleted)
History of chronic constipation, GERD, prior history of lower abdominal pain improved after chiropractor involvement. Recurrent lower abdominal pain at last visit and underwent CT that was normal Dec 2021.

## 2020-08-15 ENCOUNTER — Ambulatory Visit: Payer: Managed Care, Other (non HMO) | Admitting: Gastroenterology

## 2020-10-23 ENCOUNTER — Ambulatory Visit: Payer: Managed Care, Other (non HMO) | Admitting: Gastroenterology

## 2020-10-23 ENCOUNTER — Other Ambulatory Visit: Payer: Self-pay

## 2020-10-23 ENCOUNTER — Encounter: Payer: Self-pay | Admitting: Gastroenterology

## 2020-10-23 VITALS — BP 151/99 | HR 89 | Temp 97.3°F | Ht 67.0 in | Wt 232.0 lb

## 2020-10-23 DIAGNOSIS — R1030 Lower abdominal pain, unspecified: Secondary | ICD-10-CM | POA: Diagnosis not present

## 2020-10-23 DIAGNOSIS — K5900 Constipation, unspecified: Secondary | ICD-10-CM

## 2020-10-23 DIAGNOSIS — R5383 Other fatigue: Secondary | ICD-10-CM

## 2020-10-23 MED ORDER — LUBIPROSTONE 24 MCG PO CAPS: 24.0000 ug | ORAL_CAPSULE | Freq: Two times a day (BID) | ORAL | 3 refills | Status: DC

## 2020-10-23 NOTE — Progress Notes (Signed)
Referring Provider: Denny Levy, [place] Primary Care Physician:  Denny Levy, [place] Primary GI: Dr. Abbey Chatters  Chief Complaint  Patient presents with  . Constipation    Still having issues with having BM  . Abdominal Pain    Ongoing, no change, abd pain lower abd    HPI:   Raven Young is a 40 y.o. female presenting today with a history of  constipation and lower abdominal pain.  History of GERD and lower abdominal pain that radiates into her thighs that had previously been evaluated by GYN exhaustively including exploratory laparoscopic evaluation for endometriosis that was negative.  She was advised to see a chiropractor which significantly improved her symptoms.  Constipation: previously noted Linzess with abdominal cramping in the past in [place]. Miralax with abdomianl cramping. CT recently completed Dec 2021 that was unrevealing. Taken fiber gummies, Miralax, vitamins. If eats something really "hot" like [place] food, will have a BM. Feels bloated. Feels tired all the time. No rectal bleeding. BM about once per week. Drinks at least 1/2 gallon of water a day. Eats 2 cutie oranges a day.   Lower abdominal pain that extends to thigh. Improved when standing. Sees a Restaurant manager, fast food. Feels very fatigued. Notes lower extremity and hand paresthesias. Denies heavy menstrual cycles. Hgb 11 over a year ago. No recent labs.    Past Medical History:  Diagnosis Date  . Abnormal Pap smear of vagina   . GERD (gastroesophageal reflux disease)   . Hypertension   . Persistent headaches     Past Surgical History:  Procedure Laterality Date  . APPENDECTOMY    . DIAGNOSTIC LAPROSCOPY  10/09/15   negative exam to explain pelvic/abdominal pain  . KNEE SURGERY  90's    Current Outpatient Medications  Medication Sig Dispense Refill  . amLODipine (NORVASC) 10 MG tablet Take 10 mg by mouth daily.    . Fremanezumab-vfrm (AJOVY) 225 MG/1.5ML SOAJ Inject 225 mg into the skin every 30 (thirty)  days. 4.5 mL 3  . lubiprostone (AMITIZA) 24 MCG capsule Take 1 capsule (24 mcg total) by mouth 2 (two) times daily with a meal. 60 capsule 3  . Probiotic Product (PROBIOTIC DAILY PO) Take by mouth daily.    Marland Kitchen FIBER ADULT GUMMIES PO Take by mouth as needed. (Patient not taking: Reported on 10/23/2020)     No current facility-administered medications for this visit.    Allergies as of 10/23/2020 - Review Complete 10/23/2020  Allergen Reaction Noted  . Lisinopril Swelling 09/30/2017    Family History  Problem Relation Age of Onset  . Hypertension Mother   . Hypertension Father   . Breast cancer Maternal Aunt   . Pancreatic cancer Maternal Grandfather   . Prostate cancer Maternal Uncle   . Other Maternal Grandmother        brain tumor  . Schizophrenia Paternal Grandmother   . Stroke Paternal Grandfather   . Colon cancer Neg Hx   . Colon polyps Neg Hx     Social History   Socioeconomic History  . Marital status: Single    Spouse name: Not on file  . Number of children: Not on file  . Years of education: Not on file  . Highest education level: Not on file  Occupational History  . Not on file  Tobacco Use  . Smoking status: Never Smoker  . Smokeless tobacco: Never Used  Substance and Sexual Activity  . Alcohol use: Not Currently    Alcohol/week: 0.0 standard drinks  Comment: socially  . Drug use: No  . Sexual activity: Not on file  Other Topics Concern  . Not on file  Social History Narrative   Lives at home with daughter. Drinks one cup of caffeine a week. Right handed.    Social Determinants of Health   Financial Resource Strain: Not on file  Food Insecurity: Not on file  Transportation Needs: Not on file  Physical Activity: Not on file  Stress: Not on file  Social Connections: Not on file    Review of Systems: Gen: Denies fever, chills, anorexia. Denies fatigue, weakness, weight loss.  CV: Denies chest pain, palpitations, syncope, peripheral edema, and  claudication. Resp: Denies dyspnea at rest, cough, wheezing, coughing up blood, and pleurisy. GI: see HPI Derm: Denies rash, itching, dry skin Psych: Denies depression, anxiety, memory loss, confusion. No homicidal or suicidal ideation.  Heme: Denies bruising, bleeding, and enlarged lymph nodes.  Physical Exam: BP (!) 151/99   Pulse 89   Temp (!) 97.3 F (36.3 C)   Ht 5\' 7"  (1.702 m)   Wt 232 lb (105.2 kg)   LMP 10/03/2020   BMI 36.34 kg/m  General:   Alert and oriented. No distress noted. Pleasant and cooperative.  Head:  Normocephalic and atraumatic. Eyes:  Conjuctiva clear without scleral icterus. Mouth:  Mask in place Abdomen:  +BS, soft, non-tender and non-distended. No rebound or guarding. No HSM or masses noted. Msk:  Symmetrical without gross deformities. Normal posture. Extremities:  Without edema. Neurologic:  Alert and  oriented x4 Psych:  Alert and cooperative. Normal mood and affect.  ASSESSMENT: [name] is a 40 y.o. female presenting today with history of chronic constipation and lower abdominal pain/thigh pain that has been ongoing for at least 5 years. She is eager to find answers and achieve a better bowel regimen. CT on file recently unrevealing but no recent labs.   Thus far, she has only tried OTC agents, which are not effective. Linzess in remote past caused abdominal cramping, so she would prefer to avoid this. As she is only having one BM a week, we need to be more aggressive. Starting Amitiza 24 mcg po BID with food.   Notable fatigue per patient that is worsening. No overt GI bleeding. No family history of colorectal cancer or polyps. Multiple labs ordered today. If IDA, needs at minimum a colonoscopy.    PLAN:  Amitiza 24 mcg po BID Extensive labs ordered today Return in 3 months regardless Further recommendations to follow  [name], PhD, ANP-BC [facility]

## 2020-10-23 NOTE — Patient Instructions (Signed)
Please complete blood work at [facility] (this is actually across from the Emergency Room Entrance and not in the same building as GYN). This is a 2 story building on [address] and located on the second floor.  I have sent in Amitiza to take twice a day WITH FOOD to avoid nausea. Make sure to take with food. You can decrease this to once per day if needed.  If evidence for iron deficiency, we will pursue a colonoscopy.  Will see you in 3 months regardless but work on getting on a good regimen in the meantime!  It was a pleasure to see you today. I want to create trusting relationships with patients to provide genuine, compassionate, and quality care. I value your feedback. If you receive a survey regarding your visit,  I greatly appreciate you taking time to fill this out.   [name], PhD, ANP-BC [facility] Gastroenter[facility]

## 2020-10-26 ENCOUNTER — Other Ambulatory Visit: Payer: Self-pay | Admitting: Gastroenterology

## 2020-10-26 LAB — COMPLETE METABOLIC PANEL WITH GFR
AG Ratio: 1.6 (calc) (ref 1.0–2.5)
ALT: 20 U/L (ref 6–29)
AST: 14 U/L (ref 10–30)
Albumin: 4.2 g/dL (ref 3.6–5.1)
Alkaline phosphatase (APISO): 73 U/L (ref 31–125)
BUN: 15 mg/dL (ref 7–25)
CO2: 25 mmol/L (ref 20–32)
Calcium: 8.8 mg/dL (ref 8.6–10.2)
Chloride: 104 mmol/L (ref 98–110)
Creat: 0.9 mg/dL (ref 0.50–1.10)
GFR, Est African American: 93 mL/min/{1.73_m2} (ref >=60)
GFR, Est Non African American: 80 mL/min/{1.73_m2} (ref >=60)
Globulin: 2.6 g/dL (calc) (ref 1.9–3.7)
Glucose, Bld: 85 mg/dL (ref 65–99)
Potassium: 3.8 mmol/L (ref 3.5–5.3)
Sodium: 138 mmol/L (ref 135–146)
Total Bilirubin: 0.4 mg/dL (ref 0.2–1.2)
Total Protein: 6.8 g/dL (ref 6.1–8.1)

## 2020-10-26 LAB — CBC WITH DIFFERENTIAL/PLATELET
Absolute Monocytes: 558 cells/uL (ref 200–950)
Basophils Absolute: 27 cells/uL (ref 0–200)
Basophils Relative: 0.3 %
Eosinophils Absolute: 90 cells/uL (ref 15–500)
Eosinophils Relative: 1 %
HCT: 40.3 % (ref 35.0–45.0)
Hemoglobin: 13.6 g/dL (ref 11.7–15.5)
Lymphs Abs: 2376 cells/uL (ref 850–3900)
MCH: 30.5 pg (ref 27.0–33.0)
MCHC: 33.7 g/dL (ref 32.0–36.0)
MCV: 90.4 fL (ref 80.0–100.0)
MPV: 11.1 fL (ref 7.5–12.5)
Monocytes Relative: 6.2 %
Neutro Abs: 5949 cells/uL (ref 1500–7800)
Neutrophils Relative %: 66.1 %
Platelets: 249 10*3/uL (ref 140–400)
RBC: 4.46 10*6/uL (ref 3.80–5.10)
RDW: 13.3 % (ref 11.0–15.0)
Total Lymphocyte: 26.4 %
WBC: 9 10*3/uL (ref 3.8–10.8)

## 2020-10-26 LAB — VITAMIN D 25 HYDROXY (VIT D DEFICIENCY, FRACTURES): Vit D, 25-Hydroxy: 16 ng/mL — ABNORMAL LOW (ref 30–100)

## 2020-10-26 LAB — B12 AND FOLATE PANEL
Folate: 9.7 ng/mL
Vitamin B-12: 706 pg/mL (ref 200–1100)

## 2020-10-26 LAB — IRON,TIBC AND FERRITIN PANEL
%SAT: 17 % (calc) (ref 16–45)
Ferritin: 48 ng/mL (ref 16–154)
Iron: 59 ug/dL (ref 40–190)
TIBC: 357 mcg/dL (calc) (ref 250–450)

## 2020-10-26 LAB — METHYLMALONIC ACID, SERUM: Methylmalonic Acid, Quant: 81 nmol/L — ABNORMAL LOW (ref 87–318)

## 2020-10-26 LAB — TSH: TSH: 2.2 mIU/L

## 2020-10-26 LAB — HOMOCYSTEINE: Homocysteine: 8.3 umol/L (ref <10.4)

## 2020-10-26 MED ORDER — VITAMIN D (ERGOCALCIFEROL) 1.25 MG (50000 UNIT) PO CAPS: 50000.0000 [IU] | ORAL_CAPSULE | ORAL | 1 refills | Status: DC

## 2021-01-08 ENCOUNTER — Other Ambulatory Visit: Payer: Self-pay | Admitting: *Deleted

## 2021-01-08 ENCOUNTER — Telehealth: Payer: Self-pay | Admitting: Internal Medicine

## 2021-01-08 DIAGNOSIS — E559 Vitamin D deficiency, unspecified: Secondary | ICD-10-CM

## 2021-01-08 NOTE — Telephone Encounter (Signed)
Spoke to pt. She was informed that she does need repeat Vit D per last result note. Did not see any repeat Vit D orders so entered into Epic.  Routing to Roseanne Kaufman, NP as Juluis Rainier.

## 2021-01-08 NOTE — Telephone Encounter (Signed)
Please call patient about her labs, wants to know if she has to have them done

## 2021-01-09 NOTE — Telephone Encounter (Signed)
Thank you so much! I had requested a recall lab, so I'm glad the patient called. Thanks, Angie!

## 2021-01-17 LAB — VITAMIN D 25 HYDROXY (VIT D DEFICIENCY, FRACTURES): Vit D, 25-Hydroxy: 29 ng/mL — ABNORMAL LOW (ref 30–100)

## 2021-01-23 ENCOUNTER — Ambulatory Visit: Payer: Managed Care, Other (non HMO) | Admitting: Gastroenterology

## 2021-01-23 ENCOUNTER — Encounter: Payer: Self-pay | Admitting: Gastroenterology

## 2021-01-23 ENCOUNTER — Other Ambulatory Visit: Payer: Self-pay | Admitting: *Deleted

## 2021-01-23 ENCOUNTER — Telehealth: Payer: Self-pay | Admitting: *Deleted

## 2021-01-23 ENCOUNTER — Encounter: Payer: Self-pay | Admitting: *Deleted

## 2021-01-23 ENCOUNTER — Other Ambulatory Visit: Payer: Self-pay

## 2021-01-23 VITALS — BP 141/95 | HR 76 | Temp 97.7°F | Ht 67.0 in | Wt 231.4 lb

## 2021-01-23 DIAGNOSIS — E559 Vitamin D deficiency, unspecified: Secondary | ICD-10-CM

## 2021-01-23 DIAGNOSIS — R1030 Lower abdominal pain, unspecified: Secondary | ICD-10-CM

## 2021-01-23 DIAGNOSIS — R1013 Epigastric pain: Secondary | ICD-10-CM

## 2021-01-23 DIAGNOSIS — R140 Abdominal distension (gaseous): Secondary | ICD-10-CM | POA: Diagnosis not present

## 2021-01-23 DIAGNOSIS — R110 Nausea: Secondary | ICD-10-CM

## 2021-01-23 MED ORDER — PEG 3350-KCL-NA BICARB-NACL 420 G PO SOLR: ORAL | 0 refills | Status: DC

## 2021-01-23 NOTE — Progress Notes (Signed)
Referring Provider: Denny Levy, [place] Primary Care Physician:  Raven Young, [place] Primary GI: Dr. Abbey Young    Chief Complaint  Patient presents with   Abdominal Pain    Burning sensation   Fatigue    Better when took Vitamin D   Constipation    Takes med if needed to help her go    HPI:   Raven Young is a 40 y.o. female presenting today with a history of constipation and chroni lower abdominal pain.  History of GERD and lower abdominal pain that radiates into her thighs that had previously been evaluated by GYN exhaustively including exploratory laparoscopic evaluation for endometriosis that was negative. CT recently Dec 2021 unrevealing.   Presents in follow-up today. Vit D deficiency noted on labs drawn by [place] in April 2022. Received weekly dosing of 50,000 units for 8 weeks. Repeat check now with improvement but insufficient. Has felt persistently fatigued. No evidence for IDA, B12, folate deficiency. TSH normal.   Chronic lower abdominal pain that extends to thigh improved with standing. Hurts with laying down and sitting. Lately has been nauseated. Intermittent nausea. Felt worsened with eating. Still feels bloated at times. Taking Amitiza prn. No rectal bleeding. Taking Amitiza with food. No GERD. Sometimes will have mild discomfort in epigastric region, which is new. No dysphagia. Rare NSAIDs. Feels bloated.   Desires colonoscopy.   Past Medical History:  Diagnosis Date   Abnormal Pap smear of vagina    GERD (gastroesophageal reflux disease)    Hypertension    Persistent headaches     Past Surgical History:  Procedure Laterality Date   APPENDECTOMY     DIAGNOSTIC LAPROSCOPY  10/09/15   negative exam to explain pelvic/abdominal pain   KNEE SURGERY  90's    Current Outpatient Medications  Medication Sig Dispense Refill   amLODipine (NORVASC) 10 MG tablet Take 10 mg by mouth daily.     Fremanezumab-vfrm (AJOVY) 225 MG/1.5ML SOAJ Inject 225 mg into the skin  every 30 (thirty) days. 4.5 mL 3   lubiprostone (AMITIZA) 24 MCG capsule Take 1 capsule (24 mcg total) by mouth 2 (two) times daily with a meal. (Patient taking differently: Take 24 mcg by mouth as needed.) 60 capsule 3   Vitamin D, Ergocalciferol, (DRISDOL) 1.25 MG (50000 UNIT) CAPS capsule Take 1 capsule (50,000 Units total) by mouth every 7 (seven) days. 8 capsule 1   No current facility-administered medications for this visit.    Allergies as of 01/23/2021 - Review Complete 01/23/2021  Allergen Reaction Noted   Lisinopril Swelling 09/30/2017    Family History  Problem Relation Age of Onset   Hypertension Mother    Hypertension Father    Breast cancer Maternal Aunt    Pancreatic cancer Maternal Grandfather    Prostate cancer Maternal Uncle    Other Maternal Grandmother        brain tumor   Schizophrenia Paternal Grandmother    Stroke Paternal Grandfather    Colon cancer Neg Hx    Colon polyps Neg Hx     Social History   Socioeconomic History   Marital status: Single    Spouse name: Not on file   Number of children: Not on file   Years of education: Not on file   Highest education level: Not on file  Occupational History   Not on file  Tobacco Use   Smoking status: Never   Smokeless tobacco: Never  Substance and Sexual Activity   Alcohol use: Yes  Comment: sometimes   Drug use: No   Sexual activity: Not on file  Other Topics Concern   Not on file  Social History Narrative   Lives at home with daughter. Drinks one cup of caffeine a week. Right handed.    Social Determinants of Health   Financial Resource Strain: Not on file  Food Insecurity: Not on file  Transportation Needs: Not on file  Physical Activity: Not on file  Stress: Not on file  Social Connections: Not on file    Review of Systems: Gen: Denies fever, chills, anorexia. Denies fatigue, weakness, weight loss.  CV: Denies chest pain, palpitations, syncope, peripheral edema, and  claudication. Resp: Denies dyspnea at rest, cough, wheezing, coughing up blood, and pleurisy. GI: see HPI Derm: Denies rash, itching, dry skin Psych: Denies depression, anxiety, memory loss, confusion. No homicidal or suicidal ideation.  Heme: Denies bruising, bleeding, and enlarged lymph nodes.  Physical Exam: BP (!) 141/95   Pulse 76   Temp 97.7 F (36.5 C) (Temporal)   Ht '5\' 7"'$  (1.702 m)   Wt 231 lb 6.4 oz (105 kg)   LMP 01/12/2021 (Approximate)   BMI 36.24 kg/m  General:   Alert and oriented. No distress noted. Pleasant and cooperative.  Head:  Normocephalic and atraumatic. Eyes:  Conjuctiva clear without scleral icterus. Mouth:  mask in place Cardiac:  S1 S2 present Lungs: clear bilaterally Abdomen:  +BS, soft, TTP lower abdomen and mildly TTP epigastric and non-distended. No rebound or guarding. No HSM or masses noted. Msk:  Symmetrical without gross deformities. Normal posture. Extremities:  Without edema. Neurologic:  Alert and  oriented x4 Psych:  Alert and cooperative. Normal mood and affect.  ASSESSMENT: Raven Young is a delightful 40 y.o. female presenting today with long-standing history of lower abdominal pain, constipation, GERD, and fatigue.   Lower abdominal pain: extensive evaluation including CT Dec 2021 unrevealing, GYN involvement with negative exploratory laparoscopic evaluation in 2017. No improvement with bowel regimen. Interesting symptoms with pain extending to thighs, only aggravated by sitting and laying down. Desires colonoscopy. At this point, she has had negative evaluation thus far and likely low yield colonoscopy; however, will pursue in near future.  Constipation: doing well with Amitiza 24 mcg prn, novel dosing. NO improvement in lower abdominal pain despite bowel regimen. No overt GI bleeding.   GERD: no typical symptoms. Epigastric pain is new with associated nausea. No prior EGD. Rare NSAIDs. No melena. Pursuing diagnostic EGD at time of  colonoscopy.   Bloating: chronic despite bowel regimen. Mild nausea recently. Doubt gluten allergy but checking celiac serologies to be thorough in light of her persistent issues. Does not seem consistent with gastroparesis.    Fatigue: extensive labs including CBC, CMP, iron studies, TSH, B12, folate panel recently. Vit D deficiency improved with weekly dosing Vit D supplementation. Will continue another 8 weeks of this and then have her take OTC Vit D daily. See PCP for further evaluation. Checking celiac serologies.   PLAN:  Proceed with colonoscopy/EGD with possible small bowel biopsies by Dr. Abbey Young  in near future: the risks, benefits, and alternatives have been discussed with the patient in detail. The patient states understanding and desires to proceed.   Continue Amitiza 24 mcg prn as this is working well for her  Vit D 50,000 iu weekly for another 8 weeks, repeat Vit D level at end, start Vit D 2,000 iu when completing weekly dosing  Celiac serologies  Return thereafter   [name],  PhD, ANP-BC [facility]

## 2021-01-23 NOTE — Telephone Encounter (Signed)
PA submitted via [employer] for [employer] for EGD. Pending clinical review. Clinicals uploaded. Case # [id number]

## 2021-01-23 NOTE — H&P (View-Only) (Signed)
Referring Provider: Denny Levy, [place] Primary Care Physician:  Raven Young, [place] Primary GI: Dr. Abbey Young    Chief Complaint  Patient presents with   Abdominal Pain    Burning sensation   Fatigue    Better when took Vitamin D   Constipation    Takes med if needed to help her go    HPI:   Raven Young is a 40 y.o. female presenting today with a history of constipation and chroni lower abdominal pain.  History of GERD and lower abdominal pain that radiates into her thighs that had previously been evaluated by GYN exhaustively including exploratory laparoscopic evaluation for endometriosis that was negative. CT recently Dec 2021 unrevealing.   Presents in follow-up today. Vit D deficiency noted on labs drawn by [place] in April 2022. Received weekly dosing of 50,000 units for 8 weeks. Repeat check now with improvement but insufficient. Has felt persistently fatigued. No evidence for IDA, B12, folate deficiency. TSH normal.   Chronic lower abdominal pain that extends to thigh improved with standing. Hurts with laying down and sitting. Lately has been nauseated. Intermittent nausea. Felt worsened with eating. Still feels bloated at times. Taking Amitiza prn. No rectal bleeding. Taking Amitiza with food. No GERD. Sometimes will have mild discomfort in epigastric region, which is new. No dysphagia. Rare NSAIDs. Feels bloated.   Desires colonoscopy.   Past Medical History:  Diagnosis Date   Abnormal Pap smear of vagina    GERD (gastroesophageal reflux disease)    Hypertension    Persistent headaches     Past Surgical History:  Procedure Laterality Date   APPENDECTOMY     DIAGNOSTIC LAPROSCOPY  10/09/15   negative exam to explain pelvic/abdominal pain   KNEE SURGERY  90's    Current Outpatient Medications  Medication Sig Dispense Refill   amLODipine (NORVASC) 10 MG tablet Take 10 mg by mouth daily.     Fremanezumab-vfrm (AJOVY) 225 MG/1.5ML SOAJ Inject 225 mg into the skin  every 30 (thirty) days. 4.5 mL 3   lubiprostone (AMITIZA) 24 MCG capsule Take 1 capsule (24 mcg total) by mouth 2 (two) times daily with a meal. (Patient taking differently: Take 24 mcg by mouth as needed.) 60 capsule 3   Vitamin D, Ergocalciferol, (DRISDOL) 1.25 MG (50000 UNIT) CAPS capsule Take 1 capsule (50,000 Units total) by mouth every 7 (seven) days. 8 capsule 1   No current facility-administered medications for this visit.    Allergies as of 01/23/2021 - Review Complete 01/23/2021  Allergen Reaction Noted   Lisinopril Swelling 09/30/2017    Family History  Problem Relation Age of Onset   Hypertension Mother    Hypertension Father    Breast cancer Maternal Aunt    Pancreatic cancer Maternal Grandfather    Prostate cancer Maternal Uncle    Other Maternal Grandmother        brain tumor   Schizophrenia Paternal Grandmother    Stroke Paternal Grandfather    Colon cancer Neg Hx    Colon polyps Neg Hx     Social History   Socioeconomic History   Marital status: Single    Spouse name: Not on file   Number of children: Not on file   Years of education: Not on file   Highest education level: Not on file  Occupational History   Not on file  Tobacco Use   Smoking status: Never   Smokeless tobacco: Never  Substance and Sexual Activity   Alcohol use: Yes  Comment: sometimes   Drug use: No   Sexual activity: Not on file  Other Topics Concern   Not on file  Social History Narrative   Lives at home with daughter. Drinks one cup of caffeine a week. Right handed.    Social Determinants of Health   Financial Resource Strain: Not on file  Food Insecurity: Not on file  Transportation Needs: Not on file  Physical Activity: Not on file  Stress: Not on file  Social Connections: Not on file    Review of Systems: Gen: Denies fever, chills, anorexia. Denies fatigue, weakness, weight loss.  CV: Denies chest pain, palpitations, syncope, peripheral edema, and  claudication. Resp: Denies dyspnea at rest, cough, wheezing, coughing up blood, and pleurisy. GI: see HPI Derm: Denies rash, itching, dry skin Psych: Denies depression, anxiety, memory loss, confusion. No homicidal or suicidal ideation.  Heme: Denies bruising, bleeding, and enlarged lymph nodes.  Physical Exam: BP (!) 141/95   Pulse 76   Temp 97.7 F (36.5 C) (Temporal)   Ht '5\' 7"'$  (1.702 m)   Wt 231 lb 6.4 oz (105 kg)   LMP 01/12/2021 (Approximate)   BMI 36.24 kg/m  General:   Alert and oriented. No distress noted. Pleasant and cooperative.  Head:  Normocephalic and atraumatic. Eyes:  Conjuctiva clear without scleral icterus. Mouth:  mask in place Cardiac:  S1 S2 present Lungs: clear bilaterally Abdomen:  +BS, soft, TTP lower abdomen and mildly TTP epigastric and non-distended. No rebound or guarding. No HSM or masses noted. Msk:  Symmetrical without gross deformities. Normal posture. Extremities:  Without edema. Neurologic:  Alert and  oriented x4 Psych:  Alert and cooperative. Normal mood and affect.  ASSESSMENT: Raven Young is a delightful 40 y.o. female presenting today with long-standing history of lower abdominal pain, constipation, GERD, and fatigue.   Lower abdominal pain: extensive evaluation including CT Dec 2021 unrevealing, GYN involvement with negative exploratory laparoscopic evaluation in 2017. No improvement with bowel regimen. Interesting symptoms with pain extending to thighs, only aggravated by sitting and laying down. Desires colonoscopy. At this point, she has had negative evaluation thus far and likely low yield colonoscopy; however, will pursue in near future.  Constipation: doing well with Amitiza 24 mcg prn, novel dosing. NO improvement in lower abdominal pain despite bowel regimen. No overt GI bleeding.   GERD: no typical symptoms. Epigastric pain is new with associated nausea. No prior EGD. Rare NSAIDs. No melena. Pursuing diagnostic EGD at time of  colonoscopy.   Bloating: chronic despite bowel regimen. Mild nausea recently. Doubt gluten allergy but checking celiac serologies to be thorough in light of her persistent issues. Does not seem consistent with gastroparesis.    Fatigue: extensive labs including CBC, CMP, iron studies, TSH, B12, folate panel recently. Vit D deficiency improved with weekly dosing Vit D supplementation. Will continue another 8 weeks of this and then have her take OTC Vit D daily. See PCP for further evaluation. Checking celiac serologies.   PLAN:  Proceed with colonoscopy/EGD with possible small bowel biopsies by Dr. Abbey Young  in near future: the risks, benefits, and alternatives have been discussed with the patient in detail. The patient states understanding and desires to proceed.   Continue Amitiza 24 mcg prn as this is working well for her  Vit D 50,000 iu weekly for another 8 weeks, repeat Vit D level at end, start Vit D 2,000 iu when completing weekly dosing  Celiac serologies  Return thereafter   [name],  PhD, ANP-BC [facility]

## 2021-01-23 NOTE — Telephone Encounter (Signed)
PA approved.   Authorization Number: [id number] DOS 01/23/2021-07/22/2021

## 2021-01-23 NOTE — Patient Instructions (Signed)
Continue the 50,000 units of Vit D for another 8 weeks. We will repeat Vit D level when that is done. When you are finished with it, you can start taking Vit D 2,000 ius daily (which is over-the-counter)  We are arranging a colonoscopy and upper endoscopy with Dr. Abbey Chatters in the near future!  Please have labs done when you are able!  I enjoyed seeing you again today! As you know, I value our relationship and want to provide genuine, compassionate, and quality care. I welcome your feedback. If you receive a survey regarding your visit,  I greatly appreciate you taking time to fill this out. See you next time!  [name], PhD, ANP-BC [facility] Gastroenterology

## 2021-01-24 LAB — IGA: Immunoglobulin A: 98 mg/dL (ref 47–310)

## 2021-01-24 LAB — TISSUE TRANSGLUTAMINASE, IGA: (tTG) Ab, IgA: <1 U/mL

## 2021-01-30 ENCOUNTER — Other Ambulatory Visit (HOSPITAL_COMMUNITY)
Admission: RE | Admit: 2021-01-30 | Discharge: 2021-01-30 | Disposition: A | Discharge status: Home / Self Care | Payer: Managed Care, Other (non HMO) | Source: Ambulatory Visit | Attending: Internal Medicine | Admitting: Internal Medicine

## 2021-01-30 ENCOUNTER — Other Ambulatory Visit: Payer: Self-pay

## 2021-01-30 DIAGNOSIS — R110 Nausea: Secondary | ICD-10-CM | POA: Insufficient documentation

## 2021-01-30 DIAGNOSIS — R1013 Epigastric pain: Secondary | ICD-10-CM | POA: Diagnosis present

## 2021-01-30 LAB — PREGNANCY, URINE: Preg Test, Ur: NEGATIVE

## 2021-02-01 ENCOUNTER — Ambulatory Visit (HOSPITAL_COMMUNITY): Payer: Managed Care, Other (non HMO) | Admitting: Anesthesiology

## 2021-02-01 ENCOUNTER — Encounter (HOSPITAL_COMMUNITY)
Admission: RE | Disposition: A | Discharge status: Home / Self Care | Payer: Self-pay | Source: Ambulatory Visit | Attending: Internal Medicine

## 2021-02-01 ENCOUNTER — Encounter (HOSPITAL_COMMUNITY): Payer: Self-pay

## 2021-02-01 ENCOUNTER — Other Ambulatory Visit: Payer: Self-pay

## 2021-02-01 ENCOUNTER — Ambulatory Visit (HOSPITAL_COMMUNITY)
Admission: RE | Admit: 2021-02-01 | Discharge: 2021-02-01 | Disposition: A | Discharge status: Home / Self Care | Payer: Managed Care, Other (non HMO) | Source: Ambulatory Visit | Attending: Internal Medicine | Admitting: Internal Medicine

## 2021-02-01 DIAGNOSIS — Z8249 Family history of ischemic heart disease and other diseases of the circulatory system: Secondary | ICD-10-CM | POA: Diagnosis not present

## 2021-02-01 DIAGNOSIS — Z803 Family history of malignant neoplasm of breast: Secondary | ICD-10-CM | POA: Diagnosis not present

## 2021-02-01 DIAGNOSIS — Z8042 Family history of malignant neoplasm of prostate: Secondary | ICD-10-CM | POA: Diagnosis not present

## 2021-02-01 DIAGNOSIS — K5900 Constipation, unspecified: Secondary | ICD-10-CM | POA: Diagnosis not present

## 2021-02-01 DIAGNOSIS — K219 Gastro-esophageal reflux disease without esophagitis: Secondary | ICD-10-CM | POA: Diagnosis not present

## 2021-02-01 DIAGNOSIS — D124 Benign neoplasm of descending colon: Secondary | ICD-10-CM | POA: Diagnosis not present

## 2021-02-01 DIAGNOSIS — R140 Abdominal distension (gaseous): Secondary | ICD-10-CM

## 2021-02-01 DIAGNOSIS — R1030 Lower abdominal pain, unspecified: Secondary | ICD-10-CM | POA: Diagnosis present

## 2021-02-01 DIAGNOSIS — K648 Other hemorrhoids: Secondary | ICD-10-CM | POA: Diagnosis not present

## 2021-02-01 DIAGNOSIS — K2970 Gastritis, unspecified, without bleeding: Secondary | ICD-10-CM | POA: Diagnosis not present

## 2021-02-01 DIAGNOSIS — Z800 Family history of malignant neoplasm of digestive organs: Secondary | ICD-10-CM | POA: Insufficient documentation

## 2021-02-01 DIAGNOSIS — K635 Polyp of colon: Secondary | ICD-10-CM

## 2021-02-01 DIAGNOSIS — Z79899 Other long term (current) drug therapy: Secondary | ICD-10-CM | POA: Diagnosis not present

## 2021-02-01 HISTORY — PX: POLYPECTOMY: SHX5525

## 2021-02-01 HISTORY — PX: ESOPHAGOGASTRODUODENOSCOPY (EGD) WITH PROPOFOL: SHX5813

## 2021-02-01 HISTORY — PX: BIOPSY: SHX5522

## 2021-02-01 HISTORY — PX: COLONOSCOPY WITH PROPOFOL: SHX5780

## 2021-02-01 SURGERY — COLONOSCOPY WITH PROPOFOL
Anesthesia: General

## 2021-02-01 MED ORDER — LIDOCAINE HCL (CARDIAC) PF 100 MG/5ML IV SOSY
PREFILLED_SYRINGE | INTRAVENOUS | Status: DC | PRN
  Administered 2021-02-01: 100 mg via INTRAVENOUS

## 2021-02-01 MED ORDER — LACTATED RINGERS IV SOLN
INTRAVENOUS | Status: DC
  Administered 2021-02-01: 1000 mL via INTRAVENOUS

## 2021-02-01 MED ORDER — PROPOFOL 10 MG/ML IV BOLUS
INTRAVENOUS | Status: DC | PRN
  Administered 2021-02-01: 150 ug/kg/min via INTRAVENOUS
  Administered 2021-02-01: 100 mg via INTRAVENOUS

## 2021-02-01 MED ORDER — ESMOLOL HCL 100 MG/10ML IV SOLN
INTRAVENOUS | Status: DC | PRN
  Administered 2021-02-01: 20 mg via INTRAVENOUS

## 2021-02-01 NOTE — Anesthesia Postprocedure Evaluation (Signed)
Anesthesia Post Note  Patient: Raven Young  Procedure(s) Performed: COLONOSCOPY WITH PROPOFOL ESOPHAGOGASTRODUODENOSCOPY (EGD) WITH PROPOFOL BIOPSY POLYPECTOMY  Patient location during evaluation: Phase II Anesthesia Type: General Level of consciousness: awake and alert and oriented Pain management: pain level controlled Vital Signs Assessment: post-procedure vital signs reviewed and stable Respiratory status: spontaneous breathing and respiratory function stable Cardiovascular status: blood pressure returned to baseline and stable Postop Assessment: no apparent nausea or vomiting Anesthetic complications: no   No notable events documented.   Last Vitals:  Vitals:   02/01/21 1125 02/01/21 1249  BP: (!) 136/94 (!) 99/57  Pulse: 97 95  Resp: 13 16  Temp: 37.1 C 36.6 C  SpO2: 95% 96%    Last Pain:  Vitals:   02/01/21 1249  TempSrc: Oral  PainSc: 0-No pain                 Naliah Eddington C Ravin Bendall

## 2021-02-01 NOTE — Transfer of Care (Signed)
Immediate Anesthesia Transfer of Care Note  Patient: Raven Young  Procedure(s) Performed: COLONOSCOPY WITH PROPOFOL ESOPHAGOGASTRODUODENOSCOPY (EGD) WITH PROPOFOL BIOPSY POLYPECTOMY  Patient Location: Endoscopy Unit  Anesthesia Type:General  Level of Consciousness: drowsy, patient cooperative and responds to stimulation  Airway & Oxygen Therapy: Patient Spontanous Breathing  Post-op Assessment: Report given to RN, Post -op Vital signs reviewed and stable and Patient moving all extremities X 4  Post vital signs: Reviewed and stable  Last Vitals:  Vitals Value Taken Time  BP    Temp    Pulse    Resp    SpO2      Last Pain:  Vitals:   02/01/21 1219  TempSrc:   PainSc: 5       Patients Stated Pain Goal: 8 ([id number])  Complications: No notable events documented.

## 2021-02-01 NOTE — Interval H&P Note (Signed)
History and Physical Interval Note:  02/01/2021 11:45 AM  Raven Young  has presented today for surgery, with the diagnosis of abd pain, epigastric pain, nausea.  The various methods of treatment have been discussed with the patient and family. After consideration of risks, benefits and other options for treatment, the patient has consented to  Procedure(s) with comments: COLONOSCOPY WITH PROPOFOL (N/A) - 12:30pm ESOPHAGOGASTRODUODENOSCOPY (EGD) WITH PROPOFOL (N/A) as a surgical intervention.  The patient's history has been reviewed, patient examined, no change in status, stable for surgery.  I have reviewed the patient's chart and labs.  Questions were answered to the patient's satisfaction.     [name]

## 2021-02-01 NOTE — Discharge Instructions (Addendum)
EGD Discharge instructions Please read the instructions outlined below and refer to this sheet in the next few weeks. These discharge instructions provide you with general information on caring for yourself after you leave the hospital. Your doctor may also give you specific instructions. While your treatment has been planned according to the most current medical practices available, unavoidable complications occasionally occur. If you have any problems or questions after discharge, please call your doctor. ACTIVITY You may resume your regular activity but move at a slower pace for the next 24 hours.  Take frequent rest periods for the next 24 hours.  Walking will help expel (get rid of) the air and reduce the bloated feeling in your abdomen.  No driving for 24 hours (because of the anesthesia (medicine) used during the test).  You may shower.  Do not sign any important legal documents or operate any machinery for 24 hours (because of the anesthesia used during the test).  NUTRITION Drink plenty of fluids.  You may resume your normal diet.  Begin with a light meal and progress to your normal diet.  Avoid alcoholic beverages for 24 hours or as instructed by your caregiver.  MEDICATIONS You may resume your normal medications unless your caregiver tells you otherwise.  WHAT YOU CAN EXPECT TODAY You may experience abdominal discomfort such as a feeling of fullness or "gas" pains.  FOLLOW-UP Your doctor will discuss the results of your test with you.  SEEK IMMEDIATE MEDICAL ATTENTION IF ANY OF THE FOLLOWING OCCUR: Excessive nausea (feeling sick to your stomach) and/or vomiting.  Severe abdominal pain and distention (swelling).  Trouble swallowing.  Temperature over 101 F (37.8 C).  Rectal bleeding or vomiting of blood.    Colonoscopy Discharge Instructions  Read the instructions outlined below and refer to this sheet in the next few weeks. These discharge instructions provide you with  general information on caring for yourself after you leave the hospital. Your doctor may also give you specific instructions. While your treatment has been planned according to the most current medical practices available, unavoidable complications occasionally occur.   ACTIVITY You may resume your regular activity, but move at a slower pace for the next 24 hours.  Take frequent rest periods for the next 24 hours.  Walking will help get rid of the air and reduce the bloated feeling in your belly (abdomen).  No driving for 24 hours (because of the medicine (anesthesia) used during the test).   Do not sign any important legal documents or operate any machinery for 24 hours (because of the anesthesia used during the test).  NUTRITION Drink plenty of fluids.  You may resume your normal diet as instructed by your doctor.  Begin with a light meal and progress to your normal diet. Heavy or fried foods are harder to digest and may make you feel sick to your stomach (nauseated).  Avoid alcoholic beverages for 24 hours or as instructed.  MEDICATIONS You may resume your normal medications unless your doctor tells you otherwise.  WHAT YOU CAN EXPECT TODAY Some feelings of bloating in the abdomen.  Passage of more gas than usual.  Spotting of blood in your stool or on the toilet paper.  IF YOU HAD POLYPS REMOVED DURING THE COLONOSCOPY: No aspirin products for 7 days or as instructed.  No alcohol for 7 days or as instructed.  Eat a soft diet for the next 24 hours.  FINDING OUT THE RESULTS OF YOUR TEST Not all test results are available  during your visit. If your test results are not back during the visit, make an appointment with your caregiver to find out the results. Do not assume everything is normal if you have not heard from your caregiver or the medical facility. It is important for you to follow up on all of your test results.  SEEK IMMEDIATE MEDICAL ATTENTION IF: You have more than a spotting of  blood in your stool.  Your belly is swollen (abdominal distention).  You are nauseated or vomiting.  You have a temperature over 101.  You have abdominal pain or discomfort that is severe or gets worse throughout the day.   Your EGD revealed a mild amount inflammation your stomach.  I took biopsies of this dry infection a bacteria called H. pylori.  Also took biopsies of your small bowel to rule out celiac disease.  Await pathology results, my office will contact you.  Your colonoscopy was relatively unremarkable.  I did not find any inflammation indicative of underlying inflammatory bowel disease such as Crohn's disease or ulcerative colitis.  You did have 1 polyp which I removed successfully.  Depending on what type of polyp, we will either plan on repeating colonoscopy in 5 or 10 years.    Follow-up with GI in 3 months. Call to make follow up appointment [phone] with Vicente Males.  I hope you have a great rest of your week!  Elon Alas. Abbey Chatters, D.O. Gastroenterology and Hepatology [facility]

## 2021-02-01 NOTE — Op Note (Signed)
[facility] Patient Name: Raven Young Procedure Date: 02/01/2021 12:33 PM MRN: [id number] Date of Birth: May 04, 1981 Attending MD: Elon Alas. Abbey Chatters DO CSN: [id number] Age: 40 Admit Type: Outpatient Procedure:                Colonoscopy Indications:              Lower abdominal pain Providers:                Elon Alas. Ashtin Melichar, DO, Otis Peak B. Sharon Seller, RN,                            Raphael Gibney, Technician Referring MD:              Medicines:                See the Anesthesia note for documentation of the                            administered medications Complications:            No immediate complications. Estimated Blood Loss:     Estimated blood loss was minimal. Procedure:                Pre-Anesthesia Assessment:                           - The anesthesia plan was to use monitored                            anesthesia care (MAC).                           After obtaining informed consent, the colonoscope                            was passed under direct vision. Throughout the                            procedure, the patient's blood pressure, pulse, and                            oxygen saturations were monitored continuously. The                            [id number]-[id number] ([id number]) scope was introduced through                            the anus and advanced to the the terminal ileum,                            with identification of the appendiceal orifice and                            IC valve. The colonoscopy was performed without                            difficulty. The patient tolerated the  procedure                            well. The quality of the bowel preparation was                            evaluated using the BBPS [facility] Bowel Preparation                            Scale) with scores of: Right Colon = 3, Transverse                            Colon = 3 and Left Colon = 3 (entire mucosa seen                            well with no residual staining,  small fragments of                            stool or opaque liquid). The total BBPS score                            equals 9. Scope In: 12:33:17 PM Scope Out: 12:45:09 PM Scope Withdrawal Time: 0 hours 9 minutes 12 seconds  Total Procedure Duration: 0 hours 11 minutes 52 seconds  Findings:      The perianal and digital rectal examinations were normal.      Non-bleeding internal hemorrhoids were found during endoscopy.      A 2 mm polyp was found in the descending colon. The polyp was sessile.       The polyp was removed with a cold biopsy forceps. Resection and       retrieval were complete.      The exam was otherwise without abnormality.      The terminal ileum appeared normal. Impression:               - Non-bleeding internal hemorrhoids.                           - One 2 mm polyp in the descending colon, removed                            with a cold biopsy forceps. Resected and retrieved.                           - The examination was otherwise normal.                           - The examined portion of the ileum was normal. Moderate Sedation:      Per Anesthesia Care Recommendation:           - Patient has a contact number available for                            emergencies. The signs and symptoms of potential  delayed complications were discussed with the                            patient. Return to normal activities tomorrow.                            Written discharge instructions were provided to the                            patient.                           - Resume previous diet.                           - Continue present medications.                           - Await pathology results.                           - Repeat colonoscopy in 5-10 years for surveillance.                           - Return to GI clinic in 3 months. Procedure Code(s):        --- Professional ---                           [id number], Colonoscopy, flexible; with  biopsy, single                            or multiple Diagnosis Code(s):        --- Professional ---                           K63.5, Polyp of colon                           K64.8, Other hemorrhoids                           R10.30, Lower abdominal pain, unspecified CPT copyright 2019 American Medical Association. All rights reserved. The codes documented in this report are preliminary and upon coder review may  be revised to meet current compliance requirements. Elon Alas. Abbey Chatters, DO [place] Abbey Chatters, DO 02/01/2021 12:48:25 PM This report has been signed electronically. Number of Addenda: 0

## 2021-02-01 NOTE — Op Note (Signed)
[facility] Patient Name: Raven Young Procedure Date: 02/01/2021 12:11 PM MRN: [id number] Date of Birth: [birth year] Attending MD: Elon Alas. Abbey Chatters DO CSN: [id number] Age: 40 Admit Type: Outpatient Procedure:                Upper GI endoscopy Indications:              Epigastric abdominal pain, Abdominal bloating Providers:                Elon Alas. Kristyn Obyrne, DO, Otis Peak B. Sharon Seller, RN,                            Raphael Gibney, Technician Referring MD:              Medicines:                See the Anesthesia note for documentation of the                            administered medications Complications:            No immediate complications. Estimated Blood Loss:     Estimated blood loss was minimal. Procedure:                Pre-Anesthesia Assessment:                           - The anesthesia plan was to use monitored                            anesthesia care (MAC).                           After obtaining informed consent, the endoscope was                            passed under direct vision. Throughout the                            procedure, the patient's blood pressure, pulse, and                            oxygen saturations were monitored continuously. The                            [id number]-[id number]) scope was introduced through                            the mouth, and advanced to the second part of                            duodenum. The upper GI endoscopy was accomplished                            without difficulty. The patient tolerated the                            procedure well.  Scope In: 12:24:21 PM Scope Out: 12:28:02 PM Total Procedure Duration: 0 hours 3 minutes 41 seconds  Findings:      There is no endoscopic evidence of bleeding, areas of erosion,       esophagitis, hiatal hernia, inflammation, ulcerations or varices in the       entire esophagus.      The Z-line was regular and was found 36 cm from the incisors.      Localized mild  inflammation characterized by erythema was found in the       gastric antrum. Biopsies were taken with a cold forceps for Helicobacter       pylori testing.      The duodenal bulb, first portion of the duodenum and second portion of       the duodenum were normal. Biopsies for histology were taken with a cold       forceps for evaluation of celiac disease. Impression:               - Z-line regular, 36 cm from the incisors.                           - Gastritis. Biopsied.                           - Normal duodenal bulb, first portion of the                            duodenum and second portion of the duodenum.                            Biopsied. Moderate Sedation:      Per Anesthesia Care Recommendation:           - Patient has a contact number available for                            emergencies. The signs and symptoms of potential                            delayed complications were discussed with the                            patient. Return to normal activities tomorrow.                            Written discharge instructions were provided to the                            patient.                           - Resume previous diet.                           - Continue present medications.                           - Await pathology results.                           -  Return to GI clinic in 3 months. Procedure Code(s):        --- Professional ---                           [phone], Esophagogastroduodenoscopy, flexible,                            transoral; with biopsy, single or multiple Diagnosis Code(s):        --- Professional ---                           K29.70, Gastritis, unspecified, without bleeding                           R10.13, Epigastric pain                           R14.0, Abdominal distension (gaseous) CPT copyright 2019 American Medical Association. All rights reserved. The codes documented in this report are preliminary and upon coder review may  be revised  to meet current compliance requirements. Elon Alas. Abbey Chatters, DO [place] Abbey Chatters, DO 02/01/2021 12:31:44 PM This report has been signed electronically. Number of Addenda: 0

## 2021-02-01 NOTE — Anesthesia Preprocedure Evaluation (Addendum)
Anesthesia Evaluation  Patient identified by MRN, date of birth, ID band Patient awake    Reviewed: Allergy & Precautions, NPO status , Patient's Chart, lab work & pertinent test results  Airway Mallampati: II  TM Distance: >3 FB Neck ROM: Full    Dental  (+) Dental Advisory Given Braces :   Pulmonary    Pulmonary exam normal breath sounds clear to auscultation       Cardiovascular Exercise Tolerance: Good hypertension, Pt. on medications Normal cardiovascular exam Rhythm:Regular     Neuro/Psych  Headaches,    GI/Hepatic Neg liver ROS, GERD  Medicated,  Endo/Other  negative endocrine ROS  Renal/GU negative Renal ROS     Musculoskeletal negative musculoskeletal ROS (+)   Abdominal   Peds  Hematology negative hematology ROS (+)   Anesthesia Other Findings   Reproductive/Obstetrics negative OB ROS                            Anesthesia Physical Anesthesia Plan  ASA: 2  Anesthesia Plan: General   Post-op Pain Management:    Induction: Intravenous  PONV Risk Score and Plan: Propofol infusion  Airway Management Planned: Nasal Cannula and Natural Airway  Additional Equipment:   Intra-op Plan:   Post-operative Plan:   Informed Consent: I have reviewed the patients History and Physical, chart, labs and discussed the procedure including the risks, benefits and alternatives for the proposed anesthesia with the patient or authorized representative who has indicated his/her understanding and acceptance.     Dental advisory given  Plan Discussed with: CRNA and Surgeon  Anesthesia Plan Comments:         Anesthesia Quick Evaluation

## 2021-02-05 ENCOUNTER — Telehealth: Payer: Self-pay | Admitting: Neurology

## 2021-02-05 LAB — SURGICAL PATHOLOGY

## 2021-02-05 MED ORDER — AJOVY 225 MG/1.5ML ~~LOC~~ SOAJ: 225.0000 mg | SUBCUTANEOUS | 1 refills | Status: DC

## 2021-02-05 NOTE — Telephone Encounter (Signed)
Pt requesting refill for Fremanezumab-vfrm (AJOVY) 225 MG/1.5ML SOAJ. [facility] [phone].

## 2021-02-05 NOTE — Telephone Encounter (Signed)
Refill sent but patient needs an appt for further refills.

## 2021-02-08 ENCOUNTER — Encounter: Payer: Self-pay | Admitting: *Deleted

## 2021-02-11 ENCOUNTER — Encounter (HOSPITAL_COMMUNITY): Payer: Self-pay | Admitting: Internal Medicine

## 2021-02-14 ENCOUNTER — Telehealth (INDEPENDENT_AMBULATORY_CARE_PROVIDER_SITE_OTHER): Payer: Managed Care, Other (non HMO) | Admitting: Family Medicine

## 2021-02-14 ENCOUNTER — Telehealth: Payer: Self-pay | Admitting: Family Medicine

## 2021-02-14 ENCOUNTER — Encounter: Payer: Self-pay | Admitting: Family Medicine

## 2021-02-14 ENCOUNTER — Ambulatory Visit: Payer: Managed Care, Other (non HMO) | Admitting: Neurology

## 2021-02-14 DIAGNOSIS — G43711 Chronic migraine without aura, intractable, with status migrainosus: Secondary | ICD-10-CM

## 2021-02-14 MED ORDER — AJOVY 225 MG/1.5ML ~~LOC~~ SOAJ: 225.0000 mg | SUBCUTANEOUS | 3 refills | Status: DC

## 2021-02-14 NOTE — Telephone Encounter (Signed)
Amy will prescribe and have [place] complete a PA for Ajovy again. We will hold off on PA emgality

## 2021-02-14 NOTE — Progress Notes (Signed)
PATIENT: Raven Young DOB: 22-Jul-1980  REASON FOR VISIT: follow up HISTORY FROM: patient  Virtual Visit via Telephone Note  I connected with Raven Young on 02/14/21 at  1:30 PM EDT by telephone and verified that I am speaking with the correct person using two identifiers.   I discussed the limitations, risks, security and privacy concerns of performing an evaluation and management service by telephone and the availability of in person appointments. I also discussed with the patient that there may be a patient responsible charge related to this service. The patient expressed understanding and agreed to proceed.   History of Present Illness:  02/14/21 ALL: Raven Young is a 40 y.o. female here today for follow up for migraines. She was started on Ajovy in 04/2020. Since, she is tolerating medication well and feels headaches are much better. She reports that insurance has denied coverage stating th she has to try sumatriptan first. She does not normally need abortive medications. She is requesting we resubmit Ajovy prescription.   History (copied from Raven Young previous note)  HPI:  Raven Young is a 40 y.o. female here as requested by Raven Young, [place] for persistent headaches.  Past medical history headaches and obesity and hypertension.  I reviewed Raven Young notes: Patient was last seen February 09, 2020, she stopped taking metoprolol on July 30 as her headaches were getting better and she had noticed some improvement, she still reported headaches, she is taking amlodipine, she is working on diet and exercise, examination in the office was unremarkable including eyes, neck, respiratory, cardiovascular.  I reviewed emergency room notes from August 22 where she presented for high blood pressure, persistent headache all day long with numbness of the left arm, having the symptoms for months in the setting of high blood pressure, she had not taken her blood pressure  medication that day, blood pressure was 144/95.  I reviewed CT of the head and cervical spine report which showed: No evidence of acute intracranial pathology, no acute fracture or subluxation of the cervical spine.  She was administered amlodipine and dexamethasone.  CBC in February 2021 was unremarkable, BMP in February 2021 showed creatinine 0.87, BUN 15, sodium 135, potassium 3.3, normal glucose and CO2.   Noticed headaches started in February, prior to that she did have episodes of headaches and had scans in the past (in 2017) but worsening since this February. She started trying to eat better. She saw Raven Young, she was treated with blood pressure medications but didn't help. In June she started noticing left arm tingling sensation. The entire arm and all the fingers, she had a headache, she went to the hospital. Pressure, head feels heavy, it moves, she has them on top of the head and in the back of the head, she went to a chiropractor which helped tremendously after adjusting the neck but the headaches didn't help on top of the head. No light or sound sensitivity. 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 Npo nausea with the headaches. Since she went to the chiropractor she does not wake up with the headaches. It has helped with the headaches. Just pressure on the head. Pain in the neck which resolved. Unclear if these are similar to the past. CT of the head and neck recently did not help. She had a sleep apnea test at home and in the hospital and  she was told she had sleep apnea. A year ago. She had it in [place]. We will see if we can find that and if appropriate sleep evaluation with Raven. Brett Young. No other focal neurologic deficits, associated symptoms, inciting events or modifiable factors.   Reviewed notes, labs and imaging from outside physicians, which showed: see  above   Observations/Objective:  Generalized: Well developed, in no acute distress  Mentation: Alert oriented to time, place, history taking. Follows all commands speech and language fluent   Assessment and Plan:  40 y.o. year old female  has a past medical history of Abnormal Pap smear of vagina, GERD (gastroesophageal reflux disease), Hypertension, and Persistent headaches. here with    ICD-10-CM   1. Chronic migraine without aura, with intractable migraine, so stated, with status migrainosus  G43.711       Raven Young has experienced significant benefit with Ajovy injections. She is tolerating medication well. She was told by her insurer that she has to try and fail sumatriptan injections prior to approval of Ajovy. I have explained the differences in these two as Ajovy is for prevention and sumatriptan for abortive therapy. She was advised to call her insurance to verify. She will send [place] a copy of email. She has tried and failed metoprolol but does not remember any other medications she has taken in the past. We have discussed option to switch to [facility] and see if copay will work versus trying oral preventatives. She is hesitant to take a pill. She requests that we resubmit rx for Ajovy and she will try to use copay card. She will call me if unable to obtain medication. She will follow up in 3-4 months.   No orders of the defined types were placed in this encounter.   Meds ordered this encounter  Medications   Fremanezumab-vfrm (AJOVY) 225 MG/1.5ML SOAJ    Sig: Inject 225 mg into the skin every 30 (thirty) days. Must be seen for further refills.    Dispense:  4.5 mL    Refill:  3    Patient has copay card; she can have medication regardless of insurance approval or copay amount. Pt must be seen for further refills.    Order Specific Question:   Supervising Provider    Answer:   Melvenia Beam [id number]      Follow Up Instructions:  I discussed the assessment and  treatment plan with the patient. The patient was provided an opportunity to ask questions and all were answered. The patient agreed with the plan and demonstrated an understanding of the instructions.   The patient was advised to call back or seek an in-person evaluation if the symptoms worsen or if the condition fails to improve as anticipated.  I provided 15 minutes of non-face-to-face time during this encounter. Patient located at their place of residence during [place] visit. Provider is in the office.    Debbora Presto, NP

## 2021-02-14 NOTE — Telephone Encounter (Signed)
PA submitted for the pt through [employer] [id number] Will wait for a response

## 2021-02-14 NOTE — Telephone Encounter (Signed)
Brandy from [facility] called stating they need a PA for Fremanezumab-vfrm (AJOVY) 225 MG/1.5ML SOAJ.

## 2021-02-18 NOTE — Telephone Encounter (Signed)
PA denied for the patient. She has only tried and failed metoporol, lisinopril and amlodipine.  "There is no indication that your patient has documentation of one of the following (i or ii): i.  Individual has had an inadequate response to two different prescription migraine prevention  therapies from different classes of migraine prophylaxis medications including the following: a.  Antiepileptic drugs (divalproex sodium, valproate, topiramate); b. Antidepressants (amitriptyline,  venlafaxine); c. Beta-blockers (metoprolol, propranolol, timolol); d. onabotulinumtoxinA (Botox); or  ii. Individual has a contraindication per FDA label, significant intolerance, or is not a candidate* for  antiepileptic drugs, antidepressants, beta-blockers, and onabotulinumtoxinA (Botox) [*Not a  candidate due to being subject to a warning per the prescribing information (labeling), having a  disease characteristic, individual clinical factor(s), or other attributes/conditions or is unable to  administer and requires this dosage formulation]. Aimovig and Ajovy are considered medically necessary for migraine headache prevention when  the patient meets all of the following criteria (A, B, and C): A. Individual is 41 years of age or older.   B. Individual has 4 or more migraine headache days per month (prior to initiating a migrainepreventative medication).   C. Documentation of one of the following (i or ii): i. Individual has had an  inadequate response to two different prescription migraine prevention therapies from different  classes of migraine prophylaxis medications including the following: a. Antiepileptic drugs  (divalproex sodium, valproate, topiramate) b. Antidepressants (amitriptyline, venlafaxine) c. Betablockers (metoprolol, propranolol, timolol) d. onabotulinumtoxinA (Botox) ii. Individual has a  contraindication per FDA label, significant intolerance, or is not a candidate* for antiepileptic drugs,   antidepressants, beta-blockers, and onabotulinumtoxinA (Botox) [*Not a candidate due to being  subject to a warning per the prescribing information (labeling), having a disease characteristic,  individual clinical factor(s), or other attributes/conditions or is unable to administer and requires this  dosage formulation]. Aimovig and Ajovy are considered medically necessary for continued use  when initial criteria are met and there is documentation of beneficial response (for example,  reduction in monthly migraine days or hours or reduction in days requiring acute migraine-specific  treatment). Aimovig and Ajovy are considered experimental, investigational or unproven for any  other use including the following (this list may not be all inclusive): 1. Acute Treatment of Migraine.  Aimovig and Ajovy have not been studied for the acute treatment of migraine. 2. Cluster  Headache, Treatment or Prevention. Aimovig has not been studied in patients with cluster  headache. The pivotal trials of Aimovig excluded patients with this condition. Ajovy has not been  found to be effective in in patients with chronic or episodic cluster headache. 3. Concurrent use (for  example, during the same time period) of two CGRP inhibitors indicated for the preventative  treatment of migraine (for example, Aimovig, Ajovy, Emgality, Nurtec ODT, Vyepti). 4. Hemiplegic  Migraine, Treatment or Prevention. Aimovig has not been studied in patients with hemiplegic  migraine. The pivotal trials of Aimovig excluded patients with this condition"  Will inform Amy Lomax, NP to determine next steps for the patient in treatment plan or if a copay card is still an option for the patient.

## 2021-02-19 MED ORDER — TOPIRAMATE 50 MG PO TABS: 50.0000 mg | ORAL_TABLET | Freq: Two times a day (BID) | ORAL | 1 refills | Status: DC

## 2021-02-19 NOTE — Addendum Note (Signed)
Addended by: Debbora Presto L on: 02/19/2021 02:42 PM   Modules accepted: Orders

## 2021-03-12 NOTE — Telephone Encounter (Addendum)
Spoke with Amy NP. We will try to get CGRP approved again instead of trying Amitriptyline. Pt has now tried two oral preventives. Perhaps Ajovy can be approved now.

## 2021-03-12 NOTE — Addendum Note (Signed)
Addended by: Gildardo Griffes on: 03/12/2021 03:53 PM   Modules accepted: Orders

## 2021-03-13 MED ORDER — AJOVY 225 MG/1.5ML ~~LOC~~ SOAJ: 225.0000 mg | SUBCUTANEOUS | 5 refills | Status: DC

## 2021-03-13 NOTE — Addendum Note (Signed)
Addended by: Gildardo Griffes on: 03/13/2021 07:55 AM   Modules accepted: Orders

## 2021-03-13 NOTE — Telephone Encounter (Signed)
Completed Ajovy PA on Cover My Meds. [id number]. Tried Amlodipine, Topiramate, Metoprolol, Lisinopril. [employer] determination anticipated within 72-120 hours.

## 2021-03-13 NOTE — Telephone Encounter (Signed)
Approvedtoday CaseId:[id number];Status:Approved;Review Type:Prior Auth;Coverage Start Date:03/13/2021;Coverage End Date:03/13/2022;

## 2021-03-14 ENCOUNTER — Other Ambulatory Visit: Payer: Self-pay | Admitting: *Deleted

## 2021-03-14 MED ORDER — AJOVY 225 MG/1.5ML ~~LOC~~ SOAJ: 225.0000 mg | SUBCUTANEOUS | 1 refills | Status: DC

## 2021-03-14 NOTE — Telephone Encounter (Signed)
Patient called and said she had been getting her Ajovy as a 38-monthsupply with the savings card.  I changed her prescription to reflect this.

## 2021-03-27 LAB — VITAMIN D 25 HYDROXY (VIT D DEFICIENCY, FRACTURES): Vit D, 25-Hydroxy: 25 ng/mL — ABNORMAL LOW (ref 30–100)

## 2021-03-29 ENCOUNTER — Other Ambulatory Visit: Payer: Self-pay | Admitting: Gastroenterology

## 2021-03-29 MED ORDER — VITAMIN D (ERGOCALCIFEROL) 1.25 MG (50000 UNIT) PO CAPS: 50000.0000 [IU] | ORAL_CAPSULE | ORAL | 0 refills | Status: DC

## 2021-03-29 NOTE — Progress Notes (Signed)
Vit D weekly for 8 weeks sent into pharmacy. Rechecking Vit D level in 8 weeks.

## 2021-04-02 ENCOUNTER — Other Ambulatory Visit: Payer: Self-pay

## 2021-04-02 DIAGNOSIS — K219 Gastro-esophageal reflux disease without esophagitis: Secondary | ICD-10-CM

## 2021-04-02 DIAGNOSIS — E559 Vitamin D deficiency, unspecified: Secondary | ICD-10-CM

## 2021-04-02 DIAGNOSIS — K5900 Constipation, unspecified: Secondary | ICD-10-CM

## 2021-05-31 LAB — VITAMIN D 25 HYDROXY (VIT D DEFICIENCY, FRACTURES): Vit D, 25-Hydroxy: 42 ng/mL (ref 30–100)

## 2021-06-03 ENCOUNTER — Other Ambulatory Visit: Payer: Self-pay | Admitting: Gastroenterology

## 2021-08-21 ENCOUNTER — Other Ambulatory Visit: Payer: Self-pay | Admitting: Neurology

## 2021-08-22 MED ORDER — AJOVY 225 MG/1.5ML ~~LOC~~ SOAJ: SUBCUTANEOUS | 0 refills | Status: DC

## 2021-08-22 NOTE — Addendum Note (Signed)
Addended by: Brandon Melnick on: 08/22/2021 08:11 AM   Modules accepted: Orders

## 2021-10-30 ENCOUNTER — Ambulatory Visit: Payer: Managed Care, Other (non HMO) | Admitting: Neurology

## 2021-11-26 ENCOUNTER — Ambulatory Visit: Payer: Managed Care, Other (non HMO) | Admitting: Neurology

## 2021-12-11 ENCOUNTER — Telehealth: Payer: Managed Care, Other (non HMO) | Admitting: Neurology

## 2021-12-11 ENCOUNTER — Encounter: Payer: Self-pay | Admitting: *Deleted

## 2021-12-11 DIAGNOSIS — G43709 Chronic migraine without aura, not intractable, without status migrainosus: Secondary | ICD-10-CM

## 2021-12-11 NOTE — Patient Instructions (Signed)
Refill injections Get notes from eye doctor

## 2021-12-11 NOTE — Progress Notes (Signed)
PATIENT: Raven Young DOB: 11-07-1980  Virtual Visit via Video Note  I connected with Raven Young on 12/11/2021 at  9:30 AM EDT by a video enabled telemedicine application and verified that I am speaking with the correct person using two identifiers.  Location: Patient: home Provider: office   I discussed the limitations of evaluation and management by telemedicine and the availability of in person appointments. The patient expressed understanding and agreed to proceed.  Follow Up Instructions:    I discussed the assessment and treatment plan with the patient. The patient was provided an opportunity to ask questions and all were answered. The patient agreed with the plan and demonstrated an understanding of the instructions.   The patient was advised to call back or seek an in-person evaluation if the symptoms worsen or if the condition fails to improve as anticipated.  I provided 20 minutes of non-face-to-face time during this encounter.   Melvenia Beam, MD   12/11/2021 fu on migraines:  Patient with migraines doing great on Ajovy. She saw an eye doctor and they ordered an MRI, [facility]. 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 She is planning to follow up with the eye doctor after MRI. We will call and get the notes. Requested notes.  She has tried and failed metoprolol, topiramate, nortriptyline but does not remember any other medications, also took amlodipine and sumatriptan  Patient complains of symptoms per HPI as well as the following symptoms: migraines, unspecified eye issue . Pertinent negatives and positives per HPI. All others negative    HPI:  Raven Young is a 41 y.o. female here as requested by Denny Levy, [place] for persistent headaches.  Past medical history headaches and obesity  and hypertension.  I reviewed Raven Young notes: Patient was last seen February 09, 2020, she stopped taking metoprolol on July 30 as her headaches were getting better and she had noticed some improvement, she still reported headaches, she is taking amlodipine, she is working on diet and exercise, examination in the office was unremarkable including eyes, neck, respiratory, cardiovascular.  I reviewed emergency room notes from August 22 where she presented for high blood pressure, persistent headache all day long with numbness of the left arm, having the symptoms for months in the setting of high blood pressure, she had not taken her blood pressure medication that day, blood pressure was 144/95.  I reviewed CT of the head and cervical spine report which showed: No evidence of acute intracranial pathology, no acute fracture or subluxation of the cervical spine.  She was administered amlodipine and dexamethasone.  CBC in February 2021 was unremarkable, BMP in February 2021 showed creatinine 0.87, BUN 15, sodium 135, potassium 3.3, normal glucose and CO2.   Noticed headaches started in February, prior to that she did have episodes of headaches and had scans in the past (in 2017) but worsening since this February. She started trying to eat better. She saw [name], she was treated with blood pressure medications but didn't help. In June she started noticing left arm tingling sensation. The entire arm and all the fingers, she had a headache, she went to the hospital. Pressure, head feels heavy, it moves, she has them on top of the head and in the back of the head, she went to a  chiropractor which helped tremendously after adjusting the neck but the headaches didn't help on top of the head. No light or sound sensitivity. 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 Npo nausea with the headaches. Since she went to  the chiropractor she does not wake up with the headaches. It has helped with the headaches. Just pressure on the head. Pain in the neck which resolved. Unclear if these are similar to the past. CT of the head and neck recently did not help. She had a sleep apnea test at home and in the hospital and she was told she had sleep apnea. A year ago. She had it in [place]. We will see if we can find that and if appropriate sleep evaluation with Dr. Brett Fairy. No other focal neurologic deficits, associated symptoms, inciting events or modifiable factors.   Reviewed notes, labs and imaging from outside physicians, which showed: see above   Observations/Objective:   Physical exam: Exam: Gen: NAD, conversant      CV: Denies palpitations or chest pain or SOB. VS: Breathing at a normal rate. Not febrile. Eyes: Conjunctivae clear without exudates or hemorrhage  Neuro: Detailed Neurologic Exam  Speech:    Speech is normal; fluent and spontaneous with normal comprehension.  Cognition:    The patient is oriented to person, place, and time;     recent and remote memory intact;     language fluent;     normal attention, concentration,     fund of knowledge Cranial Nerves:    The pupils are equal, round, and reactive to light. Visual fields are full to finger confrontation. Extraocular movements are intact.  The face is symmetric with normal sensation. The palate elevates in the midline. Hearing intact. Voice is normal. Shoulder shrug is normal. The tongue has normal motion without fasciculations.   Coordination:    Normal finger to nose  Gait:    Normal native gait  Motor Observation:   no involuntary movements noted. Tone:    Appears normal  Posture:    Posture is normal. normal erect    Strength:    Strength is anti-gravity and symmetric in the upper and lower limbs.      Sensation: intact to LT    '   Assessment and Plan:  41 y.o. year old female  has a past medical  history of Abnormal Pap smear of vagina, GERD (gastroesophageal reflux disease), Hypertension, and Persistent headaches. here with    ICD-10-CM   1. Chronic migraine without aura without status migrainosus, not intractable  G43.709        Mrs Holloman has experienced significant benefit with Ajovy injections. She is tolerating medication well. She was told by her insurer that she has to try and fail sumatriptan injections prior to approval of Ajovy. I have explained the differences in these two as Ajovy is for prevention and sumatriptan for abortive therapy. She was advised to call her insurance to verify. She will send [place] a copy of email. She has tried and failed metoprolol, topiramate, nortriptyline but does not remember any other medications, also took amlodipine and sumatriptan she has taken in the past. We have discussed option to switch to [facility] and see if copay will work versus trying oral preventatives. She is hesitant to take a pill. She requests that we resubmit rx for Ajovy and she will try to  use copay card. She will call me if unable to obtain medication. She will follow up in 3-4 months.   No orders of the defined types were placed in this encounter.    Meds ordered this encounter  Medications   Fremanezumab-vfrm (AJOVY) 225 MG/1.5ML SOAJ    Sig: Inject 1.65m  into subcutaneous skin every month.    Dispense:  1.5 mL    Refill:  11    Cancel previous prescription.      Follow Up Instructions:  I discussed the assessment and treatment plan with the patient. The patient was provided an opportunity to ask questions and all were answered. The patient agreed with the plan and demonstrated an understanding of the instructions.   The patient was advised to call back or seek an in-person evaluation if the symptoms worsen or if the condition fails to improve as anticipated.  I provided 15 minutes of non-face-to-face time during this encounter. Patient located at their place of  residence during [place]. Provider is in the office.    AMelvenia Beam MD

## 2021-12-15 ENCOUNTER — Telehealth: Payer: Self-pay | Admitting: Neurology

## 2021-12-15 DIAGNOSIS — G43709 Chronic migraine without aura, not intractable, without status migrainosus: Secondary | ICD-10-CM | POA: Insufficient documentation

## 2021-12-15 MED ORDER — AJOVY 225 MG/1.5ML ~~LOC~~ SOAJ: SUBCUTANEOUS | 11 refills | Status: DC

## 2021-12-15 NOTE — Telephone Encounter (Signed)
Hilda Blades, would you pleae call this eye doctor in [place], [facility], and get patient's last office notes please? Thank you

## 2021-12-16 NOTE — Telephone Encounter (Signed)
Request made on 12/16/21.

## 2022-03-13 ENCOUNTER — Telehealth: Payer: Self-pay | Admitting: *Deleted

## 2022-03-13 ENCOUNTER — Encounter: Payer: Self-pay | Admitting: Neurology

## 2022-03-13 NOTE — Telephone Encounter (Signed)
Approved today MCEYEM:[id number];LPNPYY:[id number];Review Type:Prior Auth;Coverage Start Date:03/13/2022;Coverage End Date:03/13/2023;

## 2022-03-13 NOTE — Telephone Encounter (Signed)
Ajovy PA completed on Cover My Meds. Key: [id number]. Awaiting determination from [facility].

## 2022-03-18 ENCOUNTER — Other Ambulatory Visit: Payer: Self-pay

## 2022-03-18 MED ORDER — AJOVY 225 MG/1.5ML ~~LOC~~ SOAJ: SUBCUTANEOUS | 2 refills | Status: DC

## 2022-06-05 ENCOUNTER — Encounter: Payer: Self-pay | Admitting: Neurology

## 2022-06-05 NOTE — Telephone Encounter (Signed)
Spoke with pt, pt stated she would like to wait and see how her headaches go before making an appointment and that she would call back to schedule an appointment if needed

## 2022-07-30 ENCOUNTER — Telehealth: Payer: Self-pay | Admitting: Neurology

## 2022-07-30 NOTE — Telephone Encounter (Signed)
error 

## 2022-08-18 NOTE — Progress Notes (Deleted)
GI Office Note    Referring Provider: Denny Levy, [place] Primary Care Physician:  Denny Levy, [place]  Primary Gastroenterologist: Elon Alas. Abbey Chatters, DO   Chief Complaint   No chief complaint on file.   History of Present Illness   Raven Young is a 42 y.o. female presenting today for follow up. Last seen 12/2020. H/o constipation, lower abd pain, GERD.   Labs***  EGD 01/2021: -gastritis (benign, no h.pylori) -normal duodenum s/p bx (benign)  Colonoscopy 01/2021: -nonbleeding internal hemorrhoids -8m polyp, tubular adenoma -next colonoscopy 5 years   Medications   Current Outpatient Medications  Medication Sig Dispense Refill   acetaminophen (TYLENOL) 500 MG tablet Take 500-1,000 mg by mouth every 6 (six) hours as needed (pain.).     amLODipine (NORVASC) 10 MG tablet Take 10 mg by mouth at bedtime.     Fremanezumab-vfrm (AJOVY) 225 MG/1.5ML SOAJ Inject 1.518m into subcutaneous skin every month. 4.5 mL 2   ibuprofen (ADVIL) 200 MG tablet Take 200-400 mg by mouth every 8 (eight) hours as needed (pain.).     ketoconazole (NIZORAL) 2 % shampoo Apply 1 application topically every 30 (thirty) days.     timolol (TIMOPTIC) 0.5 % ophthalmic solution Place 1 drop into the left eye 3 (three) times a week.     triamcinolone cream (KENALOG) 0.1 % Apply 1 application topically daily.     vitamin C (ASCORBIC ACID) 500 MG tablet Take 500 mg by mouth 3 (three) times a week.     Zinc 50 MG TABS Take 50 mg by mouth 3 (three) times a week.     No current facility-administered medications for this visit.    Allergies   Allergies as of 08/19/2022 - Review Complete 02/14/2021  Allergen Reaction Noted   Lisinopril Swelling 09/30/2017     Past Medical History   Past Medical History:  Diagnosis Date   Abnormal Pap smear of vagina    GERD (gastroesophageal reflux disease)    Hypertension    Persistent headaches     Past Surgical History   Past Surgical History:   Procedure Laterality Date   APPENDECTOMY     BIOPSY  02/01/2021   Procedure: BIOPSY;  Surgeon: CaEloise HarmanDO;  [facility];  Service: Endoscopy;;  duodenal gastric   COLONOSCOPY WITH PROPOFOL N/A 02/01/2021   Procedure: COLONOSCOPY WITH PROPOFOL;  Surgeon: CaEloise HarmanDO;  [facility];  Service: Endoscopy;  Laterality: N/A;  12:30pm   DIAGNOSTIC LAPROSCOPY  10/09/15   negative exam to explain pelvic/abdominal pain   ESOPHAGOGASTRODUODENOSCOPY (EGD) WITH PROPOFOL N/A 02/01/2021   Procedure: ESOPHAGOGASTRODUODENOSCOPY (EGD) WITH PROPOFOL;  Surgeon: CaEloise HarmanDO;  [facility];  Service: Endoscopy;  Laterality: N/A;   KNEE SURGERY  90's   POLYPECTOMY  02/01/2021   Procedure: POLYPECTOMY;  Surgeon: CaEloise HarmanDO;  [facility]: [facility];  Service: Endoscopy;;    Past Family History   Family History  Problem Relation Age of Onset   Hypertension Mother    Hypertension Father    Breast cancer Maternal Aunt    Pancreatic cancer Maternal Grandfather    Prostate cancer Maternal Uncle    Other Maternal Grandmother        brain tumor   Schizophrenia Paternal Grandmother    Stroke Paternal Grandfather    Colon cancer Neg Hx    Colon polyps Neg Hx     Past Social History   Social History  Socioeconomic History   Marital status: Single    Spouse name: Not on file   Number of children: Not on file   Years of education: Not on file   Highest education level: Not on file  Occupational History   Not on file  Tobacco Use   Smoking status: Never   Smokeless tobacco: Never  Vaping Use   Vaping Use: Never used  Substance and Sexual Activity   Alcohol use: Yes    Comment: sometimes   Drug use: No   Sexual activity: Not on file  Other Topics Concern   Not on file  Social History Narrative   Lives at home with daughter. Drinks one cup of caffeine a week. Right handed.    Social Determinants of Health   Financial  Resource Strain: Not on file  Food Insecurity: Not on file  Transportation Needs: Not on file  Physical Activity: Not on file  Stress: Not on file  Social Connections: Not on file  Intimate Partner Violence: Not on file    Review of Systems   General: Negative for anorexia, weight loss, fever, chills, fatigue, weakness. ENT: Negative for hoarseness, difficulty swallowing , nasal congestion. CV: Negative for chest pain, angina, palpitations, dyspnea on exertion, peripheral edema.  Respiratory: Negative for dyspnea at rest, dyspnea on exertion, cough, sputum, wheezing.  GI: See history of present illness. GU:  Negative for dysuria, hematuria, urinary incontinence, urinary frequency, nocturnal urination.  Endo: Negative for unusual weight change.     Physical Exam   There were no vitals taken for this visit.   General: Well-nourished, well-developed in no acute distress.  Eyes: No icterus. Mouth: Oropharyngeal mucosa moist and pink , no lesions erythema or exudate. Lungs: Clear to auscultation bilaterally.  Heart: Regular rate and rhythm, no murmurs rubs or gallops.  Abdomen: Bowel sounds are normal, nontender, nondistended, no hepatosplenomegaly or masses,  no abdominal bruits or hernia , no rebound or guarding.  Rectal: ***  Extremities: No lower extremity edema. No clubbing or deformities. Neuro: Alert and oriented x 4   Skin: Warm and dry, no jaundice.   Psych: Alert and cooperative, normal mood and affect.  Labs   *** Imaging Studies   No results found.  Assessment       PLAN   ***   Laureen Ochs. Bobby Rumpf, [place], [facility]

## 2022-08-19 ENCOUNTER — Ambulatory Visit: Payer: Managed Care, Other (non HMO) | Admitting: Gastroenterology

## 2022-08-21 ENCOUNTER — Encounter: Payer: Self-pay | Admitting: Gastroenterology

## 2022-09-01 ENCOUNTER — Telehealth (INDEPENDENT_AMBULATORY_CARE_PROVIDER_SITE_OTHER): Payer: Managed Care, Other (non HMO) | Admitting: Neurology

## 2022-09-01 ENCOUNTER — Telehealth: Payer: Self-pay | Admitting: Neurology

## 2022-09-01 DIAGNOSIS — G43709 Chronic migraine without aura, not intractable, without status migrainosus: Secondary | ICD-10-CM

## 2022-09-01 NOTE — Telephone Encounter (Signed)
..   Pt understands that although there may be some limitations with this type of visit, we will take all precautions to reduce any security or privacy concerns.  Pt understands that this will be treated like an in office visit and we will file with pt's insurance, and there may be a patient responsible charge related to this service. ? ?

## 2022-09-01 NOTE — Progress Notes (Signed)
PATIENT: Raven Young DOB: 1980/08/14  Virtual Visit via Telephone Note  I connected with Raven Young on 09/01/22 at  4:00 PM EST by telephone and verified that I am speaking with the correct person using two identifiers.  Location: Patient: home Provider: office   I discussed the limitations, risks, security and privacy concerns of performing an evaluation and management service by telephone and the availability of in person appointments. I also discussed with the patient that there may be a patient responsible charge related to this service. The patient expressed understanding and agreed to proceed.   Follow Up Instructions:    I discussed the assessment and treatment plan with the patient. The patient was provided an opportunity to ask questions and all were answered. The patient agreed with the plan and demonstrated an understanding of the instructions.   The patient was advised to call back or seek an in-person evaluation if the symptoms worsen or if the condition fails to improve as anticipated.  I provided more than 11 minutes of non-face-to-face time during this encounter.   Melvenia Beam, MD   09/01/2022: She had a miscarriage. She is going to to obgyn to discuss birth control, not planning on anymore children, we discussed teratogenicity of Ajovy and has to be off of ajovy for 6 month. Now waking up with headache but when on Ajovy were not having any headaches or even any pressure headaches. She is going to start Ajovy again and hopefully the headaches will go away again. Having a problem getting emgality, try getting a new card and if she can't find it we can help  Patient complains of symptoms per HPI as well as the following symptoms: miscarriage . Pertinent negatives and positives per HPI. All others negative    12/11/2021 fu on migraines:  Patient with migraines doing great on Ajovy. She saw an eye doctor and they ordered an MRI, [place]  eyesight. 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 She is planning to follow up with the eye doctor after MRI. We will call and get the notes. Requested notes.  She has tried and failed metoprolol, topiramate, nortriptyline but does not remember any other medications, also took amlodipine and sumatriptan  Patient complains of symptoms per HPI as well as the following symptoms: migraines, unspecified eye issue . Pertinent negatives and positives per HPI. All others negative    HPI:  Raven Young is a 42 y.o. female here as requested by Denny Levy, [place] for persistent headaches.  Past medical history headaches and obesity and hypertension.  I reviewed Raven Young notes: Patient was last seen February 09, 2020, she stopped taking metoprolol on July 30 as her headaches were getting better and she had noticed some improvement, she still reported headaches, she is taking amlodipine, she is working on diet and exercise, examination in the office was unremarkable including eyes, neck, respiratory, cardiovascular.  I reviewed emergency room notes from August 22 where she presented for high blood pressure, persistent headache all day long with numbness of the left arm, having the symptoms for months in the setting of high blood pressure, she had not taken her blood pressure medication that day, blood pressure was 144/95.  I reviewed CT of the head and cervical spine report which showed: No evidence of acute intracranial  pathology, no acute fracture or subluxation of the cervical spine.  She was administered amlodipine and dexamethasone.  CBC in February 2021 was unremarkable, BMP in February 2021 showed creatinine 0.87, BUN 15, sodium 135, potassium 3.3, normal glucose and CO2.   Noticed headaches started in February, prior to that she did have episodes of  headaches and had scans in the past (in 2017) but worsening since this February. She started trying to eat better. She saw [name], she was treated with blood pressure medications but didn't help. In June she started noticing left arm tingling sensation. The entire arm and all the fingers, she had a headache, she went to the hospital. Pressure, head feels heavy, it moves, she has them on top of the head and in the back of the head, she went to a chiropractor which helped tremendously after adjusting the neck but the headaches didn't help on top of the head. No light or sound sensitivity. 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 Npo nausea with the headaches. Since she went to the chiropractor she does not wake up with the headaches. It has helped with the headaches. Just pressure on the head. Pain in the neck which resolved. Unclear if these are similar to the past. CT of the head and neck recently did not help. She had a sleep apnea test at home and in the hospital and she was told she had sleep apnea. A year ago. She had it in [place]. We will see if we can find that and if appropriate sleep evaluation with Dr. Brett Fairy. No other focal neurologic deficits, associated symptoms, inciting events or modifiable factors.   Reviewed notes, labs and imaging from outside physicians, which showed: see above  Speech:    Speech is normal; fluent and spontaneous with normal comprehension.  Cognition:    The patient is oriented to person, place, and time;     recent and remote memory intact;     language fluent;     normal attention, concentration,    Assessment and Plan:  42 y.o. year old female  has a past medical history of Abnormal Pap smear of vagina, GERD (gastroesophageal reflux disease), Hypertension, and Persistent headaches. here with    ICD-10-CM   1. Chronic migraine without  aura without status migrainosus, not intractable  G43.709         Raven Young has experienced significant benefit with Ajovy injections. She is tolerating medication well. She was told by her insurer that she has to try and fail sumatriptan injections prior to approval of Ajovy. I have explained the differences in these two as Ajovy is for prevention and sumatriptan for abortive therapy. She was advised to call her insurance to verify. She will send [place] a copy of email. She has tried and failed metoprolol, topiramate, nortriptyline, also took amlodipine and sumatriptan, maxalt she has taken in the past. We have discussed option to switch to [facility] and see if copay will work versus trying oral preventatives. She is hesitant to take a pill. She requests that we resubmit rx for Ajovy and she will try to use copay card. She will call me if unable to obtain medication. She will follow up in 3-4 months.   09/01/2022: She had a miscarriage. She is going to to obgyn to discuss birth control, not planning on anymore children, we discussed teratogenicity  of Ajovy and has to be off of ajovy for 6 month. Now waking up with headache but when on Ajovy were not having any headaches or even any pressure headaches. She is going to start Ajovy again and hopefully the headaches will go away again. Having a problem getting emgality, try getting a new card and if she can't find it we can help   Melvenia Beam, MD

## 2022-09-01 NOTE — Telephone Encounter (Signed)
Followup one year by video with NP thanks

## 2022-09-14 IMAGING — XA DG SPINAL PUNCT LUMBAR DIAG WITH FL CT GUIDANCE
1 series · 1 of 1 positions shown · non-contrast
Comparison: none

CLINICAL DATA: Headache.  Question elevated pressure.

[Series 1: ortho adipose · 1 of 1 slices shown]
[im 1/1]
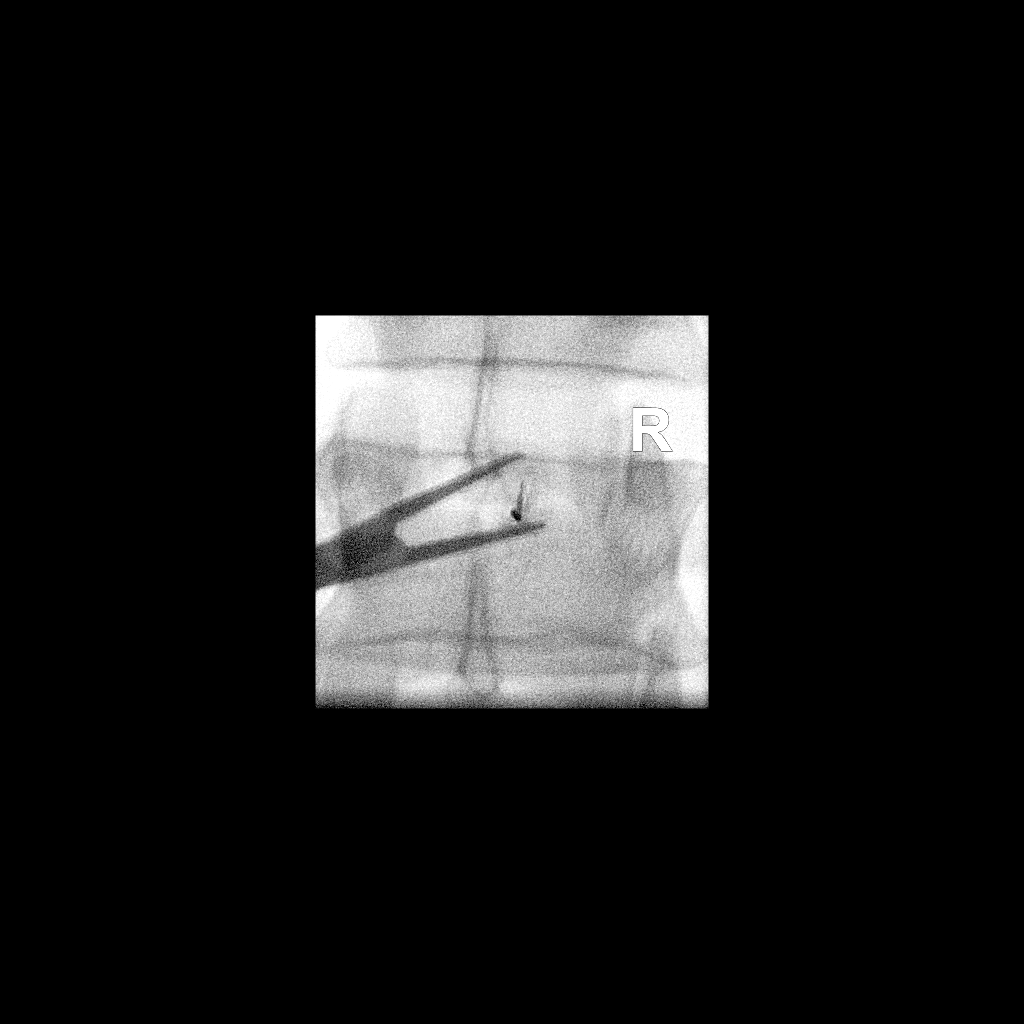

[1 of 1 positions shown; findings below may reference images not displayed]

EXAM:
DIAGNOSTIC LUMBAR PUNCTURE UNDER FLUOROSCOPIC GUIDANCE

FLUOROSCOPY TIME:  Fluoroscopy Time: 0 minutes 9 seconds. 7.39 micro
gray meter squared

PROCEDURE:
Informed consent was obtained from the patient prior to the
procedure, including potential complications of headache, allergy,
and pain. With the patient prone, the lower back was prepped with
Betadine. 1% Lidocaine was used for local anesthesia. Lumbar
puncture was performed at the right L3-4 level using a 22 gauge
needle with return of clear CSF with an opening pressure of 20 cm
water, measured in the lateral decubitus position. Eleven ml of CSF
were obtained for laboratory studies. Closing pressure was 10 cm
water. The patient tolerated the procedure well and there were no
apparent complications.
IMPRESSION: Lumbar puncture on the right at L3-4. Mildly elevated opening
pressure at 20 cm water. 11 cc clear CSF collected. Closing pressure
10 cm water.

## 2022-10-23 IMAGING — CT CT ABD-PELV W/ CM
2 of 4 series · 17 of 46 positions shown, 19 images · IV contrast (omnipaque)
Comparison: None.

CLINICAL DATA: Left lower quadrant abdominal pain and tenderness
over the last several months.

EXAM:
CT ABDOMEN AND PELVIS WITH CONTRAST
TECHNIQUE: Multidetector CT imaging of the abdomen and pelvis was performed
using the standard protocol following bolus administration of
intravenous contrast.
CONTRAST:  100mL OMNIPAQUE IOHEXOL 300 MG/ML  SOLN

[Series 2: axial st · axial · 0.61mm/px · z∈[+1113,+1533]mm · 14 of 93 slices shown, 16 images]
[im 5/93  soft-tissue]
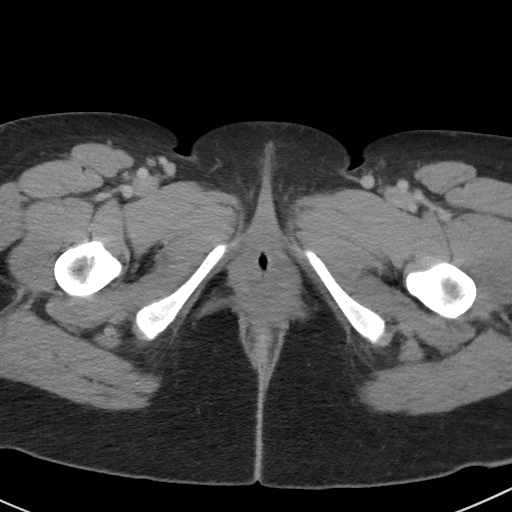
[im 5/93  bone]
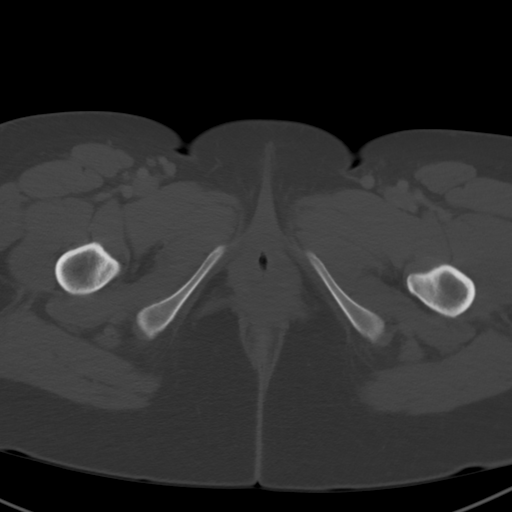
[im 13/93  soft-tissue]
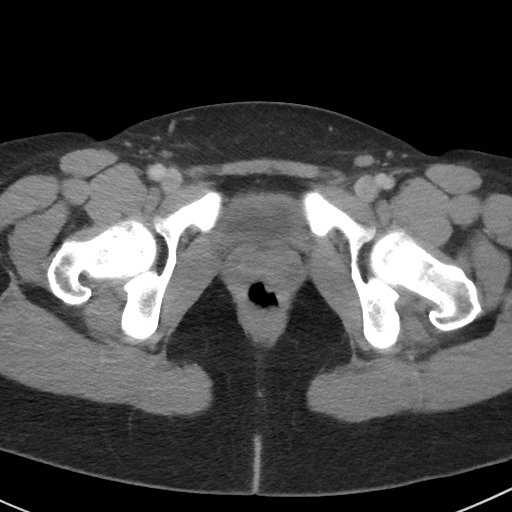
[im 17/93  soft-tissue]
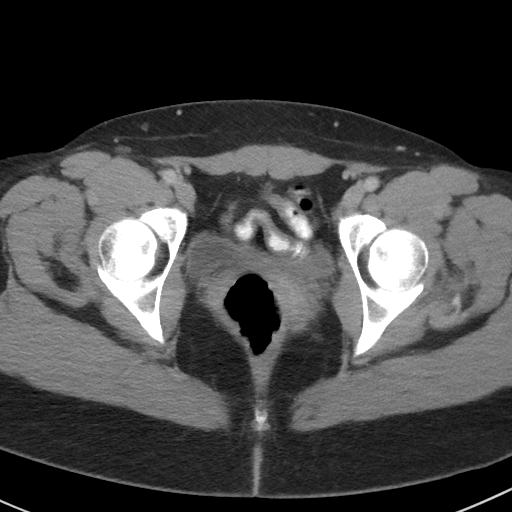
[im 25/93  soft-tissue]
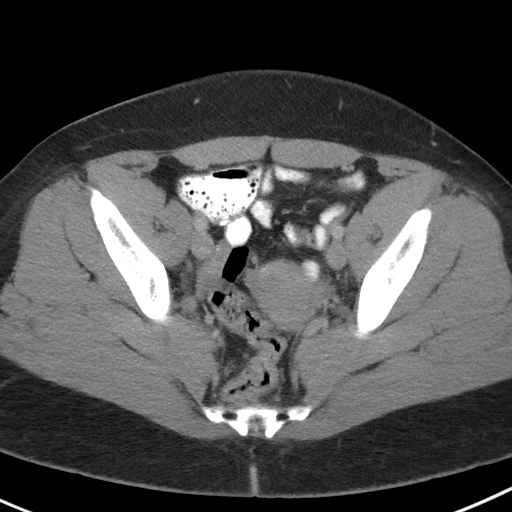
[im 33/93  soft-tissue]
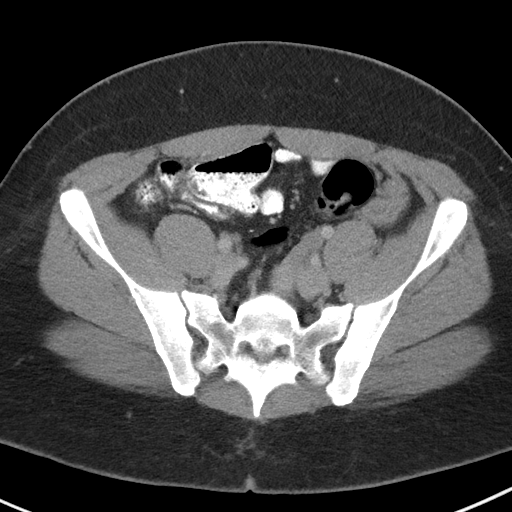
[im 37/93  soft-tissue]
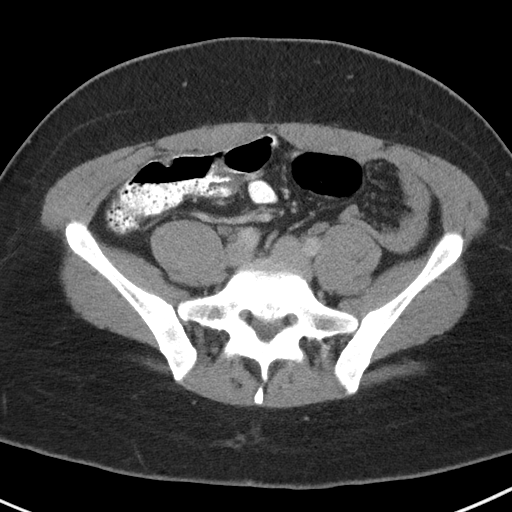
[im 45/93  soft-tissue]
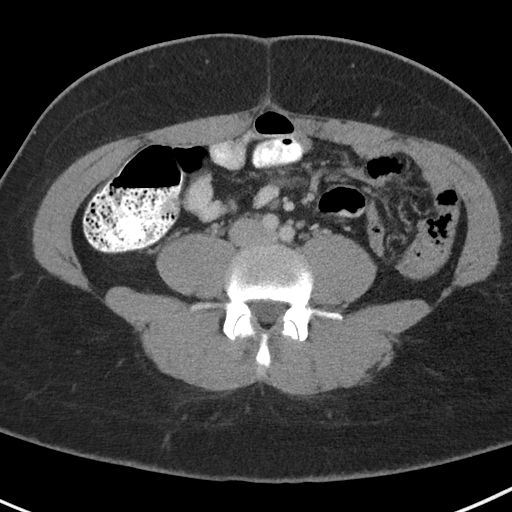
[im 49/93  soft-tissue]
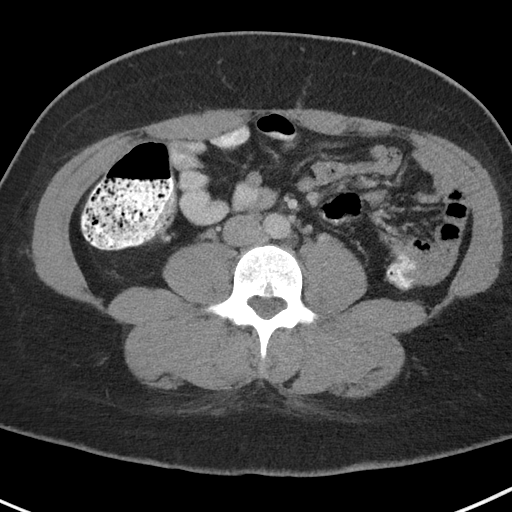
[im 57/93  soft-tissue]
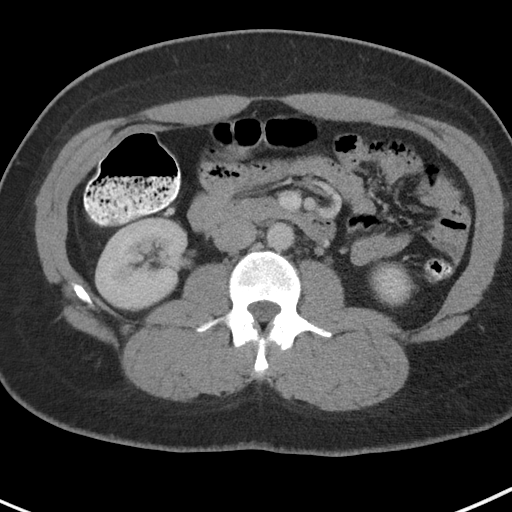
[im 57/93  bone]
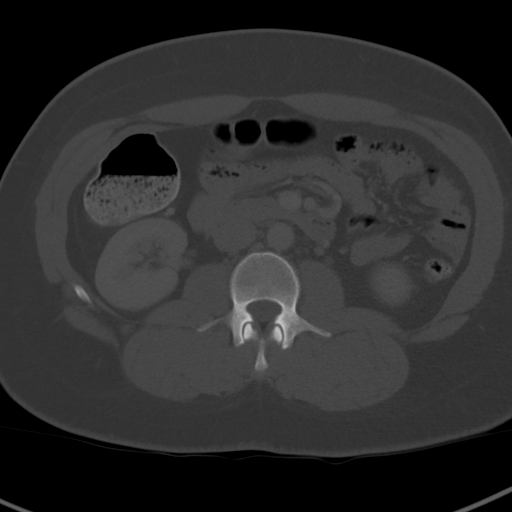
[im 61/93  soft-tissue]
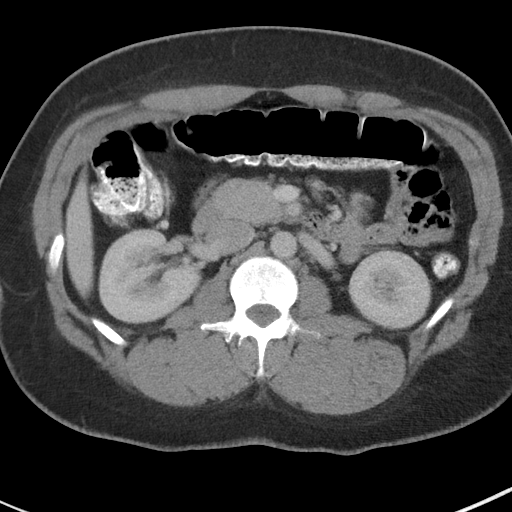
[im 69/93  soft-tissue]
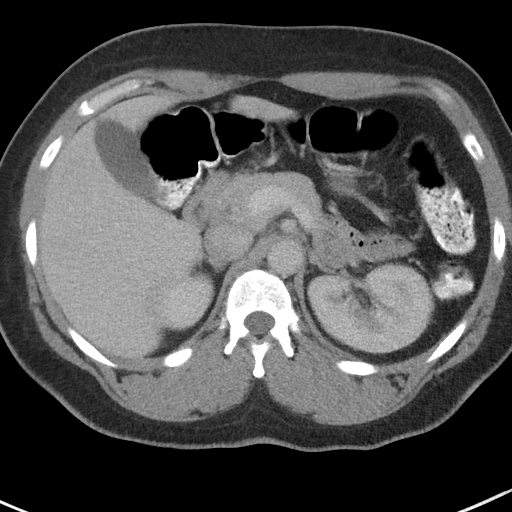
[im 77/93  soft-tissue]
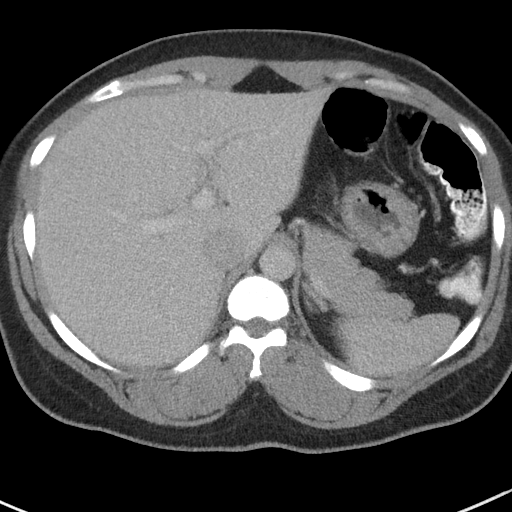
[im 81/93  soft-tissue]
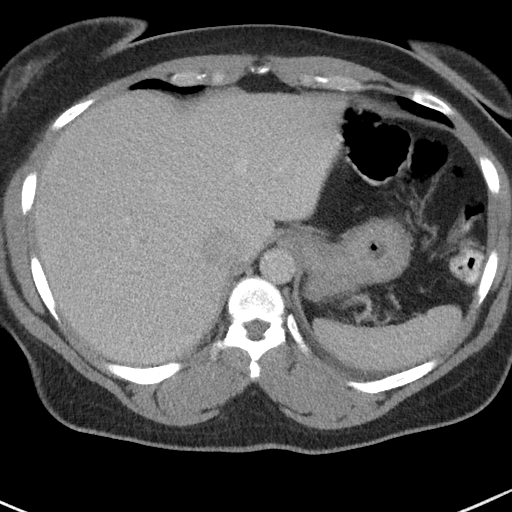
[im 89/93  soft-tissue]
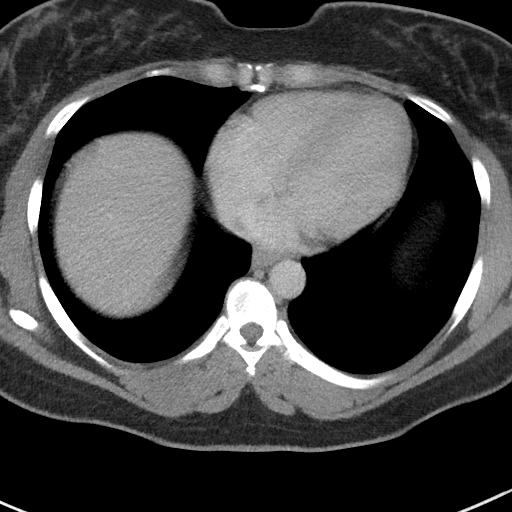

[Series 5: coronal st · coronal · 0.81mm/px · 3 of 117 slices shown]
[im 39/117  soft-tissue]
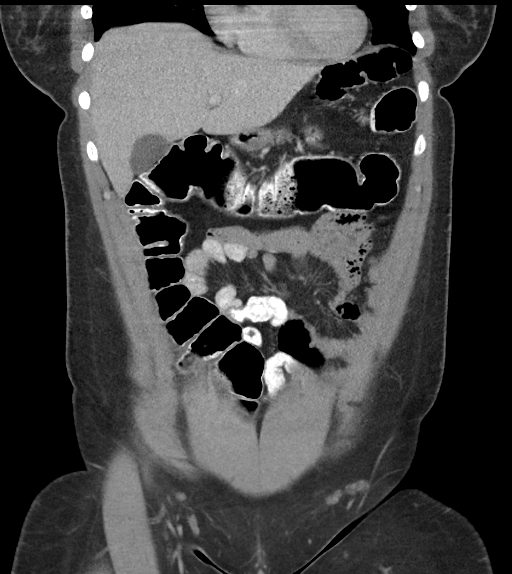
[im 52/117  soft-tissue]
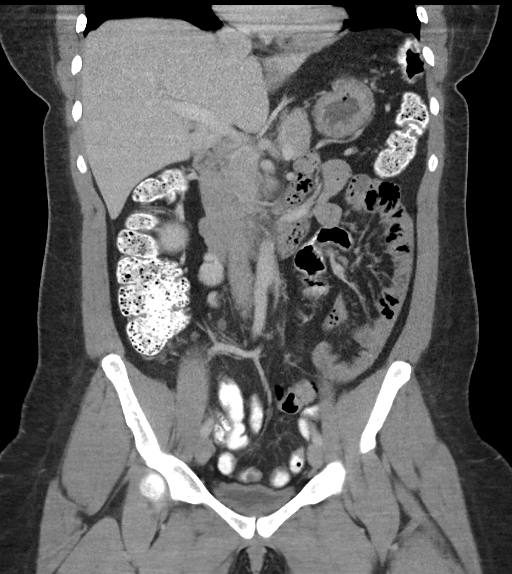
[im 65/117  soft-tissue]
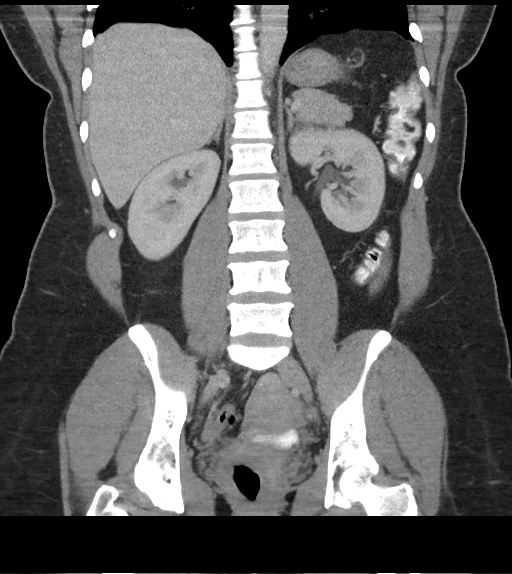

[17 of 46 positions shown; findings below may reference images not displayed]

FINDINGS: Lower chest: Normal

Hepatobiliary: Normal

Pancreas: Normal

Spleen: Normal

Adrenals/Urinary Tract: Adrenal glands are normal. Kidneys are
normal. Bladder is normal.

Stomach/Bowel: Stomach and small intestine are normal. No visible
appendix. No colon pathology is seen. Normal amount of fecal matter.
No sign of diverticulosis or diverticulitis.

Vascular/Lymphatic: Normal

Reproductive: Normal.  No pelvic mass.

Other: No free fluid or air.

Musculoskeletal: Normal
IMPRESSION: Normal CT scan of the abdomen and pelvis. No abnormality seen to
explain the presenting symptoms.

## 2023-02-03 ENCOUNTER — Ambulatory Visit: Payer: Managed Care, Other (non HMO) | Admitting: Gastroenterology

## 2023-02-11 ENCOUNTER — Ambulatory Visit: Payer: Managed Care, Other (non HMO) | Admitting: Gastroenterology

## 2023-02-11 NOTE — Progress Notes (Deleted)
GI Office Note    Referring Provider: Lawerance Sabal, [place] Primary Care Physician:  Lawerance Sabal, [place]  Primary Gastroenterologist: Hennie Duos. Marletta Lor, DO   Chief Complaint   No chief complaint on file.   History of Present Illness   Raven Young is a 42 y.o. female presenting today for follow up. Last seen in 12/2020. H/o constipation, GERD, lower abdominal pain.     Colonoscopy 01/2021: -nonbleeding internal hemorrhoids -one 2mm polyp descending colon (tubular adenoma) -colonoscopy 5 years  EGD 01/2021: -gastritis s/p bx benign with no h.pylori -normal duodenal bulb, first portion of duodenum and second portion of duodenum s/p bx, benign  Medications   Current Outpatient Medications  Medication Sig Dispense Refill   acetaminophen (TYLENOL) 500 MG tablet Take 500-1,000 mg by mouth every 6 (six) hours as needed (pain.).     amLODipine (NORVASC) 10 MG tablet Take 10 mg by mouth at bedtime.     Fremanezumab-vfrm (AJOVY) 225 MG/1.5ML SOAJ Inject 1.6ml  into subcutaneous skin every month. 4.5 mL 2   ibuprofen (ADVIL) 200 MG tablet Take 200-400 mg by mouth every 8 (eight) hours as needed (pain.).     ketoconazole (NIZORAL) 2 % shampoo Apply 1 application topically every 30 (thirty) days.     timolol (TIMOPTIC) 0.5 % ophthalmic solution Place 1 drop into the left eye 3 (three) times a week.     triamcinolone cream (KENALOG) 0.1 % Apply 1 application topically daily.     vitamin C (ASCORBIC ACID) 500 MG tablet Take 500 mg by mouth 3 (three) times a week.     Zinc 50 MG TABS Take 50 mg by mouth 3 (three) times a week.     No current facility-administered medications for this visit.    Allergies   Allergies as of 02/11/2023 - Review Complete 02/14/2021  Allergen Reaction Noted   Lisinopril Swelling 09/30/2017     Past Medical History   Past Medical History:  Diagnosis Date   Abnormal Pap smear of vagina    GERD (gastroesophageal reflux disease)    Hypertension     Persistent headaches     Past Surgical History   Past Surgical History:  Procedure Laterality Date   APPENDECTOMY     BIOPSY  02/01/2021   Procedure: BIOPSY;  Surgeon: Lanelle Bal, DO;  [facility]: [facility];  Service: Endoscopy;;  duodenal gastric   COLONOSCOPY WITH PROPOFOL N/A 02/01/2021   Procedure: COLONOSCOPY WITH PROPOFOL;  Surgeon: Lanelle Bal, DO;  Location: [facility];  Service: Endoscopy;  Laterality: N/A;  12:30pm   DIAGNOSTIC LAPROSCOPY  10/09/15   negative exam to explain pelvic/abdominal pain   ESOPHAGOGASTRODUODENOSCOPY (EGD) WITH PROPOFOL N/A 02/01/2021   Procedure: ESOPHAGOGASTRODUODENOSCOPY (EGD) WITH PROPOFOL;  Surgeon: Lanelle Bal, DO;  [facility];  Service: Endoscopy;  Laterality: N/A;   KNEE SURGERY  90's   POLYPECTOMY  02/01/2021   Procedure: POLYPECTOMY;  Surgeon: Lanelle Bal, DO;  [facility]: [facility];  Service: Endoscopy;;    Past Family History   Family History  Problem Relation Age of Onset   Hypertension Mother    Hypertension Father    Breast cancer Maternal Aunt    Pancreatic cancer Maternal Grandfather    Prostate cancer Maternal Uncle    Other Maternal Grandmother        brain tumor   Schizophrenia Paternal Grandmother    Stroke Paternal Grandfather    Colon cancer Neg Hx  Colon polyps Neg Hx     Past Social History   Social History   Socioeconomic History   Marital status: Single    Spouse name: Not on file   Number of children: Not on file   Years of education: Not on file   Highest education level: Not on file  Occupational History   Not on file  Tobacco Use   Smoking status: Never   Smokeless tobacco: Never  Vaping Use   Vaping status: Never Used  Substance and Sexual Activity   Alcohol use: Yes    Comment: sometimes   Drug use: No   Sexual activity: Not on file  Other Topics Concern   Not on file  Social History Narrative   Lives at home with daughter. Drinks one cup of  caffeine a week. Right handed.    Social Determinants of Health   Financial Resource Strain: Low Risk  (01/13/2019)   Received from [facility], [facility]   Overall Financial Resource Strain (CARDIA)    Difficulty of Paying Living Expenses: Not hard at all  Food Insecurity: Unknown (01/13/2019)   Received from [facility], [facility]   Hunger Vital Sign    Worried About Running Out of Food in the Last Year: Never true    Ran Out of Food in the Last Year: Not on file  Transportation Needs: No Transportation Needs (04/09/2022)   Received from [facility], [facility]   [facility] - Transportation    Lack of Transportation (Medical): No    Lack of Transportation (Non-Medical): No  Physical Activity: Inactive (01/13/2019)   Received from [facility], [facility]   Exercise Vital Sign    Days of Exercise per Week: 0 days    Minutes of Exercise per Session: 0 min  Stress: Stress Concern Present (01/13/2019)   Received from [facility], [facility] of Occupational Health - Occupational Stress Questionnaire    Feeling of Stress : Very much  Social Connections: Not on file  Intimate Partner Violence: Not At Risk (01/20/2023)   Received from [facility]   Humiliation, Afraid, Rape, and Kick questionnaire    Fear of Current or Ex-Partner: No    Emotionally Abused: No    Physically Abused: No    Sexually Abused: No    Review of Systems   General: Negative for anorexia, weight loss, fever, chills, fatigue, weakness. ENT: Negative for hoarseness, difficulty swallowing , nasal congestion. CV: Negative for chest pain, angina, palpitations, dyspnea on exertion, peripheral edema.  Respiratory: Negative for dyspnea at rest, dyspnea on exertion, cough, sputum, wheezing.  GI: See history of present illness. GU:  Negative for dysuria, hematuria, urinary incontinence, urinary frequency, nocturnal urination.  Endo: Negative for unusual  weight change.     Physical Exam   There were no vitals taken for this visit.   General: Well-nourished, well-developed in no acute distress.  Eyes: No icterus. Mouth: Oropharyngeal mucosa moist and pink , no lesions erythema or exudate. Lungs: Clear to auscultation bilaterally.  Heart: Regular rate and rhythm, no murmurs rubs or gallops.  Abdomen: Bowel sounds are normal, nontender, nondistended, no hepatosplenomegaly or masses,  no abdominal bruits or hernia , no rebound or guarding.  Rectal: ***  Extremities: No lower extremity edema. No clubbing or deformities. Neuro: Alert and oriented x 4   Skin: Warm and dry, no jaundice.   Psych: Alert and cooperative, normal  mood and affect.  Labs   *** Imaging Studies   No results found.  Assessment       PLAN   ***   Leanna Battles. Melvyn Neth, MHS, PA-C [facility]

## 2023-06-09 ENCOUNTER — Other Ambulatory Visit: Payer: Self-pay | Admitting: *Deleted

## 2023-06-09 MED ORDER — AJOVY 225 MG/1.5ML ~~LOC~~ SOAJ: SUBCUTANEOUS | 0 refills | Status: DC

## 2023-06-09 NOTE — Telephone Encounter (Signed)
Last seen on 09/01/22 Follow up scheduled on 09/02/23

## 2023-06-16 ENCOUNTER — Other Ambulatory Visit (HOSPITAL_COMMUNITY): Payer: Self-pay

## 2023-06-16 ENCOUNTER — Telehealth: Payer: Self-pay | Admitting: Neurology

## 2023-06-16 ENCOUNTER — Encounter: Payer: Self-pay | Admitting: Neurology

## 2023-06-16 NOTE — Telephone Encounter (Signed)
Raven Young [name]) Requesting a PA for Fremanezumab-vfrm (AJOVY) 225 MG/1.5ML SOAJ. Can send by fax to CoverMyMeds or verbally by calling [phone]

## 2023-06-17 ENCOUNTER — Telehealth: Payer: Self-pay

## 2023-06-17 ENCOUNTER — Other Ambulatory Visit (HOSPITAL_COMMUNITY): Payer: Self-pay

## 2023-06-17 NOTE — Telephone Encounter (Signed)
Pharmacy Patient Advocate Encounter   Received notification from Physician's Office that prior authorization for Ajovy auto injector is required/requested.   Insurance verification completed.   The patient is insured through [employer] .   Per test claim: PA required; PA submitted to above mentioned insurance via Phone Key/confirmation #/EOC [id number] Status is pending  [employer] handles all of the pharmacy RX medication PA's Spent 31 mins on phone submitting PA-awaiting determination Call Reference: [id number]

## 2023-06-17 NOTE — Telephone Encounter (Signed)
Pharmacy Patient Advocate Encounter  Received notification from [employer] that Prior Authorization for AJOVY (fremanezumab-vfrm) injection 225MG /1.5ML auto-injectors has been APPROVED from 06/17/2023 to 06/16/2024. Ran test claim, Copay is $24.98 for a three month supply. This test claim was processed through [facility]- copay amounts may vary at other pharmacies due to pharmacy/plan contracts, or as the patient moves through the different stages of their insurance plan.   PA #/Case ID/Reference #: [id number]

## 2023-06-17 NOTE — Telephone Encounter (Signed)
PA request has been Submitted. New Encounter created for follow up. For additional info see Pharmacy Prior Auth telephone encounter from 06/17/2023.

## 2023-08-25 ENCOUNTER — Other Ambulatory Visit: Payer: Self-pay | Admitting: Neurology

## 2023-08-27 NOTE — Patient Instructions (Signed)
 Below is our plan:  We will continue Ajovy injections every month. Continue Excedrin as needed.   Please make sure you are staying well hydrated. I recommend 50-60 ounces daily. Well balanced diet and regular exercise encouraged. Consistent sleep schedule with 6-8 hours recommended.   Please continue follow up with care team as directed.   Follow up with me in 1 year   You may receive a survey regarding today's visit. I encourage you to leave honest feed back as I do use this information to improve patient care. Thank you for seeing me today!   GENERAL HEADACHE INFORMATION:   Natural supplements: Magnesium Oxide or Magnesium Glycinate 500 mg at bed (up to 800 mg daily) Coenzyme Q10 300 mg in AM Vitamin B2- 200 mg twice a day   Add 1 supplement at a time since even natural supplements can have undesirable side effects. You can sometimes buy supplements cheaper (especially Coenzyme Q10) at www.WebmailGuide.co.za or at [facility].  Migraine with aura: There is increased risk for stroke in women with migraine with aura and a contraindication for the combined contraceptive pill for use by women who have migraine with aura. The risk for women with migraine without aura is lower. However other risk factors like smoking are far more likely to increase stroke risk than migraine. There is a recommendation for no smoking and for the use of OCPs without estrogen such as progestogen only pills particularly for women with migraine with aura.Marland Kitchen People who have migraine headaches with auras may be 3 times more likely to have a stroke caused by a blood clot, compared to migraine patients who don't see auras. Women who take hormone-replacement therapy may be 30 percent more likely to suffer a clot-based stroke than women not taking medication containing estrogen. Other risk factors like smoking and high blood pressure may be  much more important.    Vitamins and herbs that show potential:   Magnesium: Magnesium (250 mg  twice a day or 500 mg at bed) has a relaxant effect on smooth muscles such as blood vessels. Individuals suffering from frequent or daily headache usually have low magnesium levels which can be increase with daily supplementation of 400-750 mg. Three trials found 40-90% average headache reduction  when used as a preventative. Magnesium may help with headaches are aura, the best evidence for magnesium is for migraine with aura is its thought to stop the cortical spreading depression we believe is the pathophysiology of migraine aura.Magnesium also demonstrated the benefit in menstrually related migraine.  Magnesium is part of the messenger system in the serotonin cascade and it is a good muscle relaxant.  It is also useful for constipation which can be a side effect of other medications used to treat migraine. Good sources include nuts, whole grains, and tomatoes. Side Effects: loose stool/diarrhea  Riboflavin (vitamin B 2) 200 mg twice a day. This vitamin assists nerve cells in the production of ATP a principal energy storing molecule.  It is necessary for many chemical reactions in the body.  There have been at least 3 clinical trials of riboflavin using 400 mg per day all of which suggested that migraine frequency can be decreased.  All 3 trials showed significant improvement in over half of migraine sufferers.  The supplement is found in bread, cereal, milk, meat, and poultry.  Most Americans get more riboflavin than the recommended daily allowance, however riboflavin deficiency is not necessary for the supplements to help prevent headache. Side effects: energizing, green urine  Coenzyme Q10: This is present in almost all cells in the body and is critical component for the conversion of energy.  Recent studies have shown that a nutritional supplement of CoQ10 can reduce the frequency of migraine attacks by improving the energy production of cells as with riboflavin.  Doses of 150 mg twice a day have been  shown to be effective.   Melatonin: Increasing evidence shows correlation between melatonin secretion and headache conditions.  Melatonin supplementation has decreased headache intensity and duration.  It is widely used as a sleep aid.  Sleep is natures way of dealing with migraine.  A dose of 3 mg is recommended to start for headaches including cluster headache. Higher doses up to 15 mg has been reviewed for use in Cluster headache and have been used. The rationale behind using melatonin for cluster is that many theories regarding the cause of Cluster headache center around the disruption of the normal circadian rhythm in the brain.  This helps restore the normal circadian rhythm.   HEADACHE DIET: Foods and beverages which may trigger migraine Note that only 20% of headache patients are food sensitive. You will know if you are food sensitive if you get a headache consistently 20 minutes to 2 hours after eating a certain food. Only cut out a food if it causes headaches, otherwise you might remove foods you enjoy! What matters most for diet is to eat a well balanced healthy diet full of vegetables and low fat protein, and to not miss meals.   Chocolate, other sweets ALL cheeses except cottage and cream cheese Dairy products, yogurt, sour cream, ice cream Liver Meat extracts (Bovril, Marmite, meat tenderizers) Meats or fish which have undergone aging, fermenting, pickling or smoking. These include: Hotdogs,salami,Lox,sausage, mortadellas,smoked salmon, pepperoni, Pickled herring Pods of broad bean (English beans, Chinese pea pods, [place] (fava) beans, lima and navy beans Ripe avocado, ripe banana Yeast extracts or active yeast preparations such as Brewer's or Fleishman's (commercial bakes goods are permitted) Tomato based foods, pizza (lasagna, etc.)   MSG (monosodium glutamate) is disguised as many things; look for these common aliases: Monopotassium glutamate Autolysed yeast Hydrolysed  protein Sodium caseinate "flavorings" "all natural preservatives" Nutrasweet   Avoid all other foods that convincingly provoke headaches.   Resources: The Dizzy [place]air Laundry Your Headache Diet, migrainestrong.com  https://zamora-andrews.com/   Caffeine and Migraine For patients that have migraine, caffeine intake more than 3 days per week can lead to dependency and increased migraine frequency. I would recommend cutting back on your caffeine intake as best you can. The recommended amount of caffeine is 200-300 mg daily, although migraine patients may experience dependency at even lower doses. While you may notice an increase in headache temporarily, cutting back will be helpful for headaches in the long run. For more information on caffeine and migraine, visit: https://americanmigrainefoundation.org/resource-library/caffeine-and-migraine/   Headache Prevention Strategies:   1. Maintain a headache diary; learn to identify and avoid triggers.  - This can be a simple note where you log when you had a headache, associated symptoms, and medications used - There are several smartphone apps developed to help track migraines: Migraine Buddy, Migraine Monitor, Curelator N1-Headache App   Common triggers include: Emotional triggers: Emotional/Upset family or friends Emotional/Upset occupation Business reversal/success Anticipation anxiety Crisis-serious Post-crisis periodNew job/position   Physical triggers: Vacation Day Weekend Strenuous Exercise High Altitude Location New Move Menstrual Day Physical Illness Oversleep/Not enough sleep Weather changes Light: Photophobia or light sesnitivity treatment involves a balance between desensitization and reduction  in overly strong input. Use dark polarized glasses outside, but not inside. Avoid bright or fluorescent light, but do not dim environment to the point that going into a normally lit room  hurts. Consider FL-41 tint lenses, which reduce the most irritating wavelengths without blocking too much light.  These can be obtained at axonoptics.com or theraspecs.com Foods: see list above.   2. Limit use of acute treatments (over-the-counter medications, triptans, etc.) to no more than 2 days per week or 10 days per month to prevent medication overuse headache (rebound headache).     3. Follow a regular schedule (including weekends and holidays): Don't skip meals. Eat a balanced diet. 8 hours of sleep nightly. Minimize stress. Exercise 30 minutes per day. Being overweight is associated with a 5 times increased risk of chronic migraine. Keep well hydrated and drink 6-8 glasses of water per day.   4. Initiate non-pharmacologic measures at the earliest onset of your headache. Rest and quiet environment. Relax and reduce stress. Breathe2Relax is a free app that can instruct you on    some simple relaxtion and breathing techniques. Http://Dawnbuse.com is a    free website that provides teaching videos on relaxation.  Also, there are  many apps that   can be downloaded for "mindful" relaxation.  An app called YOGA NIDRA will help walk you through mindfulness. Another app called Calm can be downloaded to give you a structured mindfulness guide with daily reminders and skill development. Headspace for guided meditation Mindfulness Based Stress Reduction Online Course: www.palousemindfulness.com Cold compresses.   5. Don't wait!! Take the maximum allowable dosage of prescribed medication at the first sign of migraine.   6. Compliance:  Take prescribed medication regularly as directed and at the first sign of a migraine.   7. Communicate:  Call your physician when problems arise, especially if your headaches change, increase in frequency/severity, or become associated with neurological symptoms (weakness, numbness, slurred speech, etc.). Proceed to emergency room if you experience new or worsening  symptoms or symptoms do not resolve, if you have new neurologic symptoms or if headache is severe, or for any concerning symptom.   8. Headache/pain management therapies: Consider various complementary methods, including medication, behavioral therapy, psychological counselling, biofeedback, massage therapy, acupuncture, dry needling, and other modalities.  Such measures may reduce the need for medications. Counseling for pain management, where patients learn to function and ignore/minimize their pain, seems to work very well.   9. Recommend changing family's attention and focus away from patient's headaches. Instead, emphasize daily activities. If first question of day is 'How are your headaches/Do you have a headache today?', then patient will constantly think about headaches, thus making them worse. Goal is to re-direct attention away from headaches, toward daily activities and other distractions.   10. Helpful Websites: www.AmericanHeadacheSociety.org PatentHood.ch www.headaches.org TightMarket.nl www.achenet.org

## 2023-08-27 NOTE — Progress Notes (Addendum)
 PATIENT: Raven Young DOB: Jan 20, 1981  REASON FOR VISIT: follow up HISTORY FROM: patient  Virtual Visit via Mychart  I connected with Ledell Noss on 09/02/23 at  9:30 AM EST via Mychart video and verified that I am speaking with the correct person using two identifiers.   I discussed the limitations, risks, security and privacy concerns of performing an evaluation and management service by telephone and the availability of in person appointments. I also discussed with the patient that there may be a patient responsible charge related to this service. The patient expressed understanding and agreed to proceed.   History of Present Illness:  09/02/23 ALL (Mychart): Alexiya returns for follow up for migraines. She was last seen by Dr Lucia Gaskins 08/2022 and restarted on Ajovy. Since, she reports doing very well. She may have 1-2 migraines days per month. Excedrin usually aborts migraine. She doe shave intermittent ringing in her ears. She feels well and without concerns, today.   09/01/2022 AA (Mychart):  She had a miscarriage. She is going to to obgyn to discuss birth control, not planning on anymore children, we discussed teratogenicity of Ajovy and has to be off of ajovy for 6 month. Now waking up with headache but when on Ajovy were not having any headaches or even any pressure headaches. She is going to start Ajovy again and hopefully the headaches will go away again. Having a problem getting emgality, try getting a new card and if she can't find it we can help   Patient complains of symptoms per HPI as well as the following symptoms: miscarriage . Pertinent negatives and positives per HPI. All others negative   12/11/2021 AA (Mychart): fu on migraines:  Patient with migraines doing great on Ajovy. She saw an eye doctor and they ordered an MRI, [facility]. 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 She is planning to follow up with the eye doctor after MRI. We will call and get the notes. Requested notes.   She has tried and failed metoprolol, topiramate, nortriptyline but does not remember any other medications, also took amlodipine and sumatriptan  02/14/2021 ALL (Mychart):  Christan Ciccarelli is a 43 y.o. female here today for follow up for migraines. She was started on Ajovy in 04/2020. Since, she is tolerating medication well and feels headaches are much better. She reports that insurance has denied coverage stating th she has to try sumatriptan first. She does not normally need abortive medications. She is requesting we resubmit Ajovy prescription.   History (copied from Dr Trevor Mace previous note)  HPI:  Keenya Matera is a 43 y.o. female here as requested by Lawerance Sabal, [place] for persistent headaches.  Past medical history headaches and obesity and hypertension.  I reviewed Ulice Brilliant notes: Patient was last seen February 09, 2020, she stopped taking metoprolol on July 30 as her headaches were getting better and she had noticed some improvement, she still reported headaches, she is taking amlodipine, she is working on diet and exercise, examination in the office was unremarkable including eyes, neck, respiratory, cardiovascular.  I reviewed emergency room notes from August 22 where she presented for high blood pressure, persistent headache all day long with numbness of the left arm, having the symptoms for months in the setting of high blood pressure, she had not taken  her blood pressure medication that day, blood pressure was 144/95.  I reviewed CT of the head and cervical spine report which showed: No evidence of acute intracranial pathology, no acute fracture or subluxation of the cervical spine.  She was administered amlodipine and dexamethasone.  CBC in February 2021 was unremarkable, BMP in February 2021  showed creatinine 0.87, BUN 15, sodium 135, potassium 3.3, normal glucose and CO2.   Noticed headaches started in February, prior to that she did have episodes of headaches and had scans in the past (in 2017) but worsening since this February. She started trying to eat better. She saw Ms Bufford Buttner, she was treated with blood pressure medications but didn't help. In June she started noticing left arm tingling sensation. The entire arm and all the fingers, she had a headache, she went to the hospital. Pressure, head feels heavy, it moves, she has them on top of the head and in the back of the head, she went to a chiropractor which helped tremendously after adjusting the neck but the headaches didn't help on top of the head. No light or sound sensitivity. 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 Npo nausea with the headaches. Since she went to the chiropractor she does not wake up with the headaches. It has helped with the headaches. Just pressure on the head. Pain in the neck which resolved. Unclear if these are similar to the past. CT of the head and neck recently did not help. She had a sleep apnea test at home and in the hospital and she was told she had sleep apnea. A year ago. She had it in [facility] hospital. We will see if we can find that and if appropriate sleep evaluation with Dr. Vickey Huger. No other focal neurologic deficits, associated symptoms, inciting events or modifiable factors.   Reviewed notes, labs and imaging from outside physicians, which showed: see above   Observations/Objective:  Generalized: Well developed, in no acute distress  Mentation: Alert oriented to time, place, history taking. Follows all commands speech and language fluent   Assessment and Plan:  43 y.o. year old female  has a past medical history of Abnormal Pap smear of vagina, GERD (gastroesophageal reflux  disease), Hypertension, and Persistent headaches. here with    ICD-10-CM   1. Chronic migraine without aura without status migrainosus, not intractable  G43.709       Mrs Hayes has experienced significant benefit with Ajovy injections. She will continue as prescribed. Healthy lifestyle habits encouraged. She will follow up in 1 year.   No orders of the defined types were placed in this encounter.    Meds ordered this encounter  Medications   Fremanezumab-vfrm (AJOVY) 225 MG/1.5ML SOAJ    Sig: ADMINISTER 1.5 ML UNDER THE SKIN EVERY MONTH    Dispense:  4.5 mL    Refill:  3    Supervising Provider:   Anson Fret [[id number]]      Follow Up Instructions:  I discussed the assessment and treatment plan with the patient. The patient was provided an opportunity to ask questions and all were answered. The patient agreed with the plan and demonstrated an understanding of the instructions.   The patient was advised to call back or seek an in-person evaluation if the symptoms worsen or if the condition fails to improve as anticipated.  I provided 15 minutes of face-to-face  time during this encounter. Patient located at their place of residence during Mychart video visit. Provider is in the office.    Shawnie Dapper, NP

## 2023-09-02 ENCOUNTER — Encounter: Payer: Self-pay | Admitting: Family Medicine

## 2023-09-02 ENCOUNTER — Telehealth: Payer: Managed Care, Other (non HMO) | Admitting: Family Medicine

## 2023-09-02 DIAGNOSIS — G43709 Chronic migraine without aura, not intractable, without status migrainosus: Secondary | ICD-10-CM

## 2023-09-02 MED ORDER — AJOVY 225 MG/1.5ML ~~LOC~~ SOAJ: SUBCUTANEOUS | 3 refills | Status: AC

## 2024-07-29 ENCOUNTER — Encounter: Payer: Self-pay | Admitting: Family Medicine

## 2024-08-01 ENCOUNTER — Telehealth: Payer: Self-pay

## 2024-08-01 NOTE — Telephone Encounter (Signed)
 Can someone please look into PA or please do a PA for pt's Ajovy ?

## 2024-08-02 ENCOUNTER — Telehealth: Payer: Self-pay

## 2024-08-02 ENCOUNTER — Other Ambulatory Visit (HOSPITAL_COMMUNITY): Payer: Self-pay

## 2024-09-06 ENCOUNTER — Telehealth: Admitting: Family Medicine
